# Patient Record
Sex: Female | Born: 1960 | Race: White | Marital: Married | Smoking: Never smoker
Health system: Southern US, Community
[De-identification: ages and names within clinical notes are randomized; demographics above are authoritative.]

## PROBLEM LIST (undated history)

## (undated) DIAGNOSIS — H53489 Generalized contraction of visual field, unspecified eye: Secondary | ICD-10-CM

## (undated) DIAGNOSIS — J309 Allergic rhinitis, unspecified: Secondary | ICD-10-CM

## (undated) DIAGNOSIS — K8020 Calculus of gallbladder without cholecystitis without obstruction: Secondary | ICD-10-CM

## (undated) DIAGNOSIS — N3010 Interstitial cystitis (chronic) without hematuria: Secondary | ICD-10-CM

## (undated) DIAGNOSIS — F329 Major depressive disorder, single episode, unspecified: Secondary | ICD-10-CM

## (undated) DIAGNOSIS — E669 Obesity, unspecified: Secondary | ICD-10-CM

## (undated) DIAGNOSIS — F32A Depression, unspecified: Secondary | ICD-10-CM

## (undated) DIAGNOSIS — K5790 Diverticulosis of intestine, part unspecified, without perforation or abscess without bleeding: Secondary | ICD-10-CM

## (undated) DIAGNOSIS — K760 Fatty (change of) liver, not elsewhere classified: Secondary | ICD-10-CM

## (undated) DIAGNOSIS — M1990 Unspecified osteoarthritis, unspecified site: Secondary | ICD-10-CM

## (undated) DIAGNOSIS — F419 Anxiety disorder, unspecified: Secondary | ICD-10-CM

## (undated) HISTORY — DX: Anxiety disorder, unspecified: F41.9

## (undated) HISTORY — DX: Diverticulosis of intestine, part unspecified, without perforation or abscess without bleeding: K57.90

## (undated) HISTORY — DX: Major depressive disorder, single episode, unspecified: F32.9

## (undated) HISTORY — DX: Generalized contraction of visual field, unspecified eye: H53.489

## (undated) HISTORY — DX: Depression, unspecified: F32.A

## (undated) HISTORY — DX: Obesity, unspecified: E66.9

## (undated) HISTORY — DX: Unspecified osteoarthritis, unspecified site: M19.90

## (undated) HISTORY — DX: Interstitial cystitis (chronic) without hematuria: N30.10

## (undated) HISTORY — DX: Allergic rhinitis, unspecified: J30.9

## (undated) HISTORY — DX: Calculus of gallbladder without cholecystitis without obstruction: K80.20

## (undated) HISTORY — PX: CHOLECYSTECTOMY: SHX55

## (undated) HISTORY — DX: Fatty (change of) liver, not elsewhere classified: K76.0

---

## 2004-09-01 DIAGNOSIS — K8020 Calculus of gallbladder without cholecystitis without obstruction: Secondary | ICD-10-CM

## 2004-09-01 HISTORY — DX: Calculus of gallbladder without cholecystitis without obstruction: K80.20

## 2010-12-31 HISTORY — PX: OTHER SURGICAL HISTORY: SHX169

## 2011-04-23 ENCOUNTER — Other Ambulatory Visit: Payer: Self-pay | Admitting: Orthopaedic Surgery

## 2011-04-23 ENCOUNTER — Ambulatory Visit
Admission: RE | Admit: 2011-04-23 | Discharge: 2011-04-23 | Disposition: A | Discharge status: Home / Self Care | Payer: PRIVATE HEALTH INSURANCE | Source: Ambulatory Visit | Attending: Orthopaedic Surgery | Admitting: Orthopaedic Surgery

## 2011-04-23 DIAGNOSIS — IMO0002 Reserved for concepts with insufficient information to code with codable children: Secondary | ICD-10-CM

## 2013-03-01 HISTORY — PX: OTHER SURGICAL HISTORY: SHX169

## 2013-09-08 ENCOUNTER — Encounter: Payer: Self-pay | Admitting: Gastroenterology

## 2013-09-19 ENCOUNTER — Encounter: Payer: Self-pay | Admitting: Gastroenterology

## 2013-09-19 ENCOUNTER — Ambulatory Visit (INDEPENDENT_AMBULATORY_CARE_PROVIDER_SITE_OTHER): Payer: BC Managed Care – PPO | Admitting: Gastroenterology

## 2013-09-19 VITALS — BP 130/70 | HR 66 | Ht 64.25 in | Wt 249.0 lb

## 2013-09-19 DIAGNOSIS — K5732 Diverticulitis of large intestine without perforation or abscess without bleeding: Secondary | ICD-10-CM

## 2013-09-19 DIAGNOSIS — R933 Abnormal findings on diagnostic imaging of other parts of digestive tract: Secondary | ICD-10-CM

## 2013-09-19 DIAGNOSIS — K8020 Calculus of gallbladder without cholecystitis without obstruction: Secondary | ICD-10-CM

## 2013-09-19 MED ORDER — PEG-KCL-NACL-NASULF-NA ASC-C 100 G PO SOLR: 1.0000 | Freq: Once | ORAL | Status: DC

## 2013-09-19 NOTE — Patient Instructions (Signed)
You have been scheduled for a colonoscopy with propofol. Please follow written instructions given to you at your visit today.  Please pick up your prep kit at the pharmacy within the next 1-3 days. If you use inhalers (even only as needed), please bring them with you on the day of your procedure. Your physician has requested that you go to www.startemmi.com and enter the access code given to you at your visit today. This web site gives a general overview about your procedure. However, you should still follow specific instructions given to you by our office regarding your preparation for the procedure.  Please schedule an appointment with a PCP for ongoing care. High-Fiber Diet Fiber is found in fruits, vegetables, and grains. A high-fiber diet encourages the addition of more whole grains, legumes, fruits, and vegetables in your diet. The recommended amount of fiber for adult males is 38 g per day. For adult females, it is 25 g per day. Pregnant and lactating women should get 28 g of fiber per day. If you have a digestive or bowel problem, ask your caregiver for advice before adding high-fiber foods to your diet. Eat a variety of high-fiber foods instead of only a select few type of foods.  PURPOSE  To increase stool bulk.  To make bowel movements more regular to prevent constipation.  To lower cholesterol.  To prevent overeating. WHEN IS THIS DIET USED?  It may be used if you have constipation and hemorrhoids.  It may be used if you have uncomplicated diverticulosis (intestine condition) and irritable bowel syndrome.  It may be used if you need help with weight management.  It may be used if you want to add it to your diet as a protective measure against atherosclerosis, diabetes, and cancer. SOURCES OF FIBER  Whole-grain breads and cereals.  Fruits, such as apples, oranges, bananas, berries, prunes, and pears.  Vegetables, such as green peas, carrots, sweet potatoes, beets, broccoli,  cabbage, spinach, and artichokes.  Legumes, such split peas, soy, lentils.  Almonds. FIBER CONTENT IN FOODS Starches and Grains / Dietary Fiber (g)  Cheerios, 1 cup / 3 g  Corn Flakes cereal, 1 cup / 0.7 g  Rice crispy treat cereal, 1 cup / 0.3 g  Instant oatmeal (cooked),  cup / 2 g  Frosted wheat cereal, 1 cup / 5.1 g  Brown, long-grain rice (cooked), 1 cup / 3.5 g  White, long-grain rice (cooked), 1 cup / 0.6 g  Enriched macaroni (cooked), 1 cup / 2.5 g Legumes / Dietary Fiber (g)  Baked beans (canned, plain, or vegetarian),  cup / 5.2 g  Kidney beans (canned),  cup / 6.8 g  Pinto beans (cooked),  cup / 5.5 g Breads and Crackers / Dietary Fiber (g)  Plain or honey graham crackers, 2 squares / 0.7 g  Saltine crackers, 3 squares / 0.3 g  Plain, salted pretzels, 10 pieces / 1.8 g  Whole-wheat bread, 1 slice / 1.9 g  White bread, 1 slice / 0.7 g  Raisin bread, 1 slice / 1.2 g  Plain bagel, 3 oz / 2 g  Flour tortilla, 1 oz / 0.9 g  Corn tortilla, 1 small / 1.5 g  Hamburger or hotdog bun, 1 small / 0.9 g Fruits / Dietary Fiber (g)  Apple with skin, 1 medium / 4.4 g  Sweetened applesauce,  cup / 1.5 g  Banana,  medium / 1.5 g  Grapes, 10 grapes / 0.4 g  Orange, 1 small /  2.3 g  Raisin, 1.5 oz / 1.6 g  Melon, 1 cup / 1.4 g Vegetables / Dietary Fiber (g)  Green beans (canned),  cup / 1.3 g  Carrots (cooked),  cup / 2.3 g  Broccoli (cooked),  cup / 2.8 g  Peas (cooked),  cup / 4.4 g  Mashed potatoes,  cup / 1.6 g  Lettuce, 1 cup / 0.5 g  Corn (canned),  cup / 1.6 g  Tomato,  cup / 1.1 g Document Released: 08/18/2005 Document Revised: 02/17/2012 Document Reviewed: 11/20/2011 [facility] Patient Information 2014 [place], [place].

## 2013-09-19 NOTE — Progress Notes (Signed)
    History of Present Illness: This is a 53 year old female with alternating diarrhea constipation for many years and intermittent abdominal pain. She states over 7 or 8 years she has frequent attacks of epigastric pain that radiates into her chest lasting 10 minutes to one hour. She relates that gallstones have been diagnosed in the past and were seen on her recent CT scan. In addition she states fatty liver has been noted on previous ultrasound exams most recently in September 2014. 2 weeks ago she developed acute suprapubic pain and was diagnosed with diverticulitis. CT scan showed cholelithiasis, abnormal sigmoid colon with surrounding inflammation and a small umbilical hernia containing fat. She was treated with a ten-day course of Levaquin and Bactrim her symptoms have resolved. Denies weight loss, change in stool caliber, melena, hematochezia, nausea, vomiting, dysphagia, reflux symptoms, chest pain.  Review of Systems: Pertinent positive and negative review of systems were noted in the above HPI section. All other review of systems were otherwise negative.  Current Medications, Allergies, Past Medical History, Past Surgical History, Family History and Social History were reviewed in [employer] record.  Physical Exam: General: Well developed , well nourished, obese, no acute distress Head: Normocephalic and atraumatic Eyes:  sclerae anicteric, EOMI Ears: Normal auditory acuity Mouth: No deformity or lesions Neck: Supple, no masses or thyromegaly Lungs: Clear throughout to auscultation Heart: Regular rate and rhythm; no murmurs, rubs or bruits Abdomen: Soft, non tender and non distended. No masses, hepatosplenomegaly or hernias noted. Normal Bowel sounds Rectal: Deferred to colonoscopy Musculoskeletal: Symmetrical with no gross deformities  Skin: No lesions on visible extremities Pulses:  Normal pulses noted Extremities: No clubbing, cyanosis, edema or deformities  noted Neurological: Alert oriented x 4, grossly nonfocal Cervical Nodes:  No significant cervical adenopathy Inguinal Nodes: No significant inguinal adenopathy Psychological:  Alert and cooperative. Normal mood and affect  Assessment and Recommendations:  1. Recent diverticulitis with abnormal sigmoid colon on CT. Long-term alternating diarrhea and constipation. Schedule colonoscopy. High fiber diet with adequate daily water intake. The risks, benefits, and alternatives to colonoscopy with possible biopsy and possible polypectomy were discussed with the patient and they consent to proceed.   2. Fatty liver. Long-term weight loss program with a low fat diet advised by her primary physician. Request records from September 2014 ultrasound.  3. Cholelithiasis. Intermittent epigastric pain could be symptomatic cholelithiasis. Surgery consult after colonoscopy completed.

## 2013-09-20 ENCOUNTER — Encounter: Payer: Self-pay | Admitting: Gastroenterology

## 2013-11-01 ENCOUNTER — Telehealth: Payer: Self-pay | Admitting: Gastroenterology

## 2013-11-02 NOTE — Telephone Encounter (Signed)
Patient was started on cipro and flagyl last Friday for diverticulitis.  She has approximately 7 more days to take.  She is still having some discomfort, but over all is feeling better.  She is currently scheduled for a colonoscopy for 11/18/13.  If all symptoms resolve is it ok to keep that appt?

## 2013-11-02 NOTE — Telephone Encounter (Signed)
Patient notified.  She will call back if the pain does not resolve

## 2013-11-02 NOTE — Telephone Encounter (Signed)
Left message for patient to call back  

## 2013-11-02 NOTE — Telephone Encounter (Signed)
Patient was started on cipro and flagyl for diverticulitis last Friday.  She is still having some discomfort.  She is

## 2013-11-02 NOTE — Telephone Encounter (Signed)
OK to proceed with colonoscopy presuming her abd pain continues to improve and ideally resolve before colonoscopy.

## 2013-11-11 ENCOUNTER — Ambulatory Visit (INDEPENDENT_AMBULATORY_CARE_PROVIDER_SITE_OTHER): Payer: BC Managed Care – PPO | Admitting: Physician Assistant

## 2013-11-11 ENCOUNTER — Other Ambulatory Visit (INDEPENDENT_AMBULATORY_CARE_PROVIDER_SITE_OTHER): Payer: BC Managed Care – PPO

## 2013-11-11 ENCOUNTER — Encounter: Payer: Self-pay | Admitting: Physician Assistant

## 2013-11-11 ENCOUNTER — Telehealth: Payer: Self-pay | Admitting: Gastroenterology

## 2013-11-11 VITALS — BP 130/70 | HR 72 | Ht 64.25 in | Wt 248.6 lb

## 2013-11-11 DIAGNOSIS — K5732 Diverticulitis of large intestine without perforation or abscess without bleeding: Secondary | ICD-10-CM | POA: Insufficient documentation

## 2013-11-11 DIAGNOSIS — K8020 Calculus of gallbladder without cholecystitis without obstruction: Secondary | ICD-10-CM | POA: Insufficient documentation

## 2013-11-11 DIAGNOSIS — K7689 Other specified diseases of liver: Secondary | ICD-10-CM

## 2013-11-11 DIAGNOSIS — K760 Fatty (change of) liver, not elsewhere classified: Secondary | ICD-10-CM | POA: Insufficient documentation

## 2013-11-11 DIAGNOSIS — R109 Unspecified abdominal pain: Secondary | ICD-10-CM

## 2013-11-11 LAB — BASIC METABOLIC PANEL
BUN: 11 mg/dL (ref 6–23)
CHLORIDE: 102 meq/L (ref 96–112)
CO2: 29 mEq/L (ref 19–32)
Calcium: 9.5 mg/dL (ref 8.4–10.5)
Creatinine, Ser: 0.8 mg/dL (ref 0.4–1.2)
GFR: 82.78 mL/min (ref >60.00)
Glucose, Bld: 103 mg/dL — ABNORMAL HIGH (ref 70–99)
Potassium: 4.6 mEq/L (ref 3.5–5.1)
SODIUM: 138 meq/L (ref 135–145)

## 2013-11-11 LAB — CBC WITH DIFFERENTIAL/PLATELET
BASOS ABS: 0.1 10*3/uL (ref 0.0–0.1)
Basophils Relative: 0.7 % (ref 0.0–3.0)
Eosinophils Absolute: 0.2 10*3/uL (ref 0.0–0.7)
Eosinophils Relative: 2.9 % (ref 0.0–5.0)
HCT: 42 % (ref 36.0–46.0)
Hemoglobin: 14.2 g/dL (ref 12.0–15.0)
LYMPHS PCT: 30.2 % (ref 12.0–46.0)
Lymphs Abs: 2.5 10*3/uL (ref 0.7–4.0)
MCHC: 33.8 g/dL (ref 30.0–36.0)
MCV: 86.4 fl (ref 78.0–100.0)
Monocytes Absolute: 0.6 10*3/uL (ref 0.1–1.0)
Monocytes Relative: 7.1 % (ref 3.0–12.0)
NEUTROS ABS: 4.9 10*3/uL (ref 1.4–7.7)
Neutrophils Relative %: 59.1 % (ref 43.0–77.0)
PLATELETS: 278 10*3/uL (ref 150.0–400.0)
RBC: 4.86 Mil/uL (ref 3.87–5.11)
RDW: 14.1 % (ref 11.5–14.6)
WBC: 8.3 10*3/uL (ref 4.5–10.5)

## 2013-11-11 MED ORDER — AMOXICILLIN-POT CLAVULANATE 875-125 MG PO TABS: 1.0000 | ORAL_TABLET | Freq: Two times a day (BID) | ORAL | Status: DC

## 2013-11-11 NOTE — Patient Instructions (Signed)
We have given you samples of Restoroa, a probiotic. Take a probiotic , 1 tab daily for 3 weeks. We sent a prescription to [facility] for Augmentin.  We changed the date of the colonosocopy with Dr. Fuller Plan to 3-37-2015.  9:30 am . We have given you new instructions.

## 2013-11-11 NOTE — Telephone Encounter (Signed)
Patient reports lower abdominal pain that started yesterday.  She reports constipation and pressure.  She denies fever.  Was treated with amoxicillin at an urgent care and finished this on Monday.  She will come in today at 1:30 for a CBC/BMET and office visit with Nicoletta Ba PA at 2:30

## 2013-11-11 NOTE — Progress Notes (Signed)
Subjective:    Patient ID: Laura Waters, female    DOB: 1960-12-03, 53 y.o.   MRN: [id number]  HPI  Laura Waters  is a 53 year old white female known to Dr. Fuller Plan who was seen in the office in January of 2015 with complaints of alternating diarrhea and constipation and history of diverticulitis. Apparently she had had an episode in September of 2014 that was documented on CT scan. She was treated again in February for diverticulitis and comes back in today with recurrent pain. She is scheduled for colonoscopy with Dr. Fuller Plan in about a week.  Patient also has a large gallstone which was found on CT scan measuring 2.8 cm. She has had episodes of pain consistent with fairly or a colic but states she's not interested in having her gallbladder taken out at this time. She also has a diagnosis of fatty liver. Her most recent course of antibiotics given by primary care was Levaquin and Bactrim which she took for about 10 days and finished about 5 days ago. She says when she finished the antibiotics her pain had resolved but quickly recurred within 2 days. She says the pain is not severe but feels like the diverticulitis had previously. This started again yesterday. She's not had any fever or chills. She has had some mild urinary frequency and says that she had been constipated and has been using Colace. Her bowels are currently moving more regularly. CT scan done a [facility] in January of 2015 had documented a 2.8 cm gallstone without evidence of inflammatory changes of the gallbladder. There was a tiny fat-containing umbilical hernia and a short segment of inflammatory changes about the distal sigmoid colon likely secondary to diverticulitis.  Review of Systems  Constitutional: Positive for appetite change.  HENT: Negative.   Eyes: Negative.   Respiratory: Negative.   Cardiovascular: Negative.   Gastrointestinal: Positive for abdominal pain and constipation.  Endocrine: Negative.   Genitourinary:  Positive for frequency.  Allergic/Immunologic: Negative.   Neurological: Negative.   Hematological: Negative.   Psychiatric/Behavioral: Negative.    Outpatient Prescriptions Prior to Visit  Medication Sig Dispense Refill  . ALPRAZolam (XANAX) 0.25 MG tablet Take 0.25 mg by mouth as needed for anxiety.      . cetirizine (ZYRTEC) 10 MG tablet Take 10 mg by mouth as needed for allergies.      . CHOLINE PO Take 1 capsule by mouth daily.      Marland Kitchen OVER THE COUNTER MEDICATION Digestive enzymes, 2 capsules with meal and 1 with snacks      . OVER THE COUNTER MEDICATION Folate 800 mcg , one a day      . peg 3350 powder (MOVIPREP) 100 G SOLR Take 1 kit (200 g total) by mouth once.  1 kit  0  . Magnesium Gluconate 200 MG TABS Take 1 tablet by mouth 2 (two) times daily.       No facility-administered medications prior to visit.   Allergies  Allergen Reactions  . Flagyl [Metronidazole]   . Lidocaine   . Tylenol [Acetaminophen]    Patient Active Problem List   Diagnosis Date Noted  . Diverticulitis of colon without hemorrhage 11/11/2013  . Cholelithiasis 11/11/2013  . Fatty liver 11/11/2013   History  Substance Use Topics  . Smoking status: Never Smoker   . Smokeless tobacco: Never Used  . Alcohol Use: No     Comment: quit in 05/2013       Objective:   Physical Exam  white  female in no acute distress blood pressure 130/70 pulse 72 height 5 foot 4 weight 248, BMI 42. HEENT; nontraumatic normocephalic EOMI PERRLA sclera anicteric, Supple; no JVD, Cardiovascular; regular rate and rhythm with S1-S2 no murmur or gallop, Pulmonary ;clear bilaterally, Abdomen; large soft she is tender in the deep left lower quadrant and suprapubic area there is no guarding or rebound no palpable mass or hepatosplenomegaly bowel sounds are present, Rectal; exam not done, Extremities; no clubbing cyanosis or edema skin warm and dry, Psych; mood and affect appropriate        Assessment & Plan:  #78  53 year old  female with recurrent diverticulitis currently presenting with recurrent lower abdominal  discomfort within 3 days of finishing a course of antibiotics and probably representing a partially treated episode. #2Cholelithiasis #3 fatty liver  Plan; Will treat with a 10 day course of Augmentin 875 mg by mouth twice daily to be taken with food Start Restora  one by mouth daily x3-4 weeks Will reschedule her colonoscopy for 3-4 weeks from now to allow resolution of any residual diverticulitis.patient is aware to call next week if she does not feel that she is improving or if any point if she feels her pain is worsening.

## 2013-11-13 NOTE — Progress Notes (Signed)
Reviewed and agree with management plan.  Talishia Betzler T. Roshaun Pound, MD FACG 

## 2013-11-14 ENCOUNTER — Ambulatory Visit: Payer: BC Managed Care – PPO | Admitting: Physician Assistant

## 2013-11-17 ENCOUNTER — Encounter: Payer: Self-pay | Admitting: *Deleted

## 2013-11-18 ENCOUNTER — Other Ambulatory Visit: Payer: Self-pay | Admitting: Gastroenterology

## 2013-11-18 ENCOUNTER — Encounter: Payer: BC Managed Care – PPO | Admitting: Gastroenterology

## 2013-11-18 DIAGNOSIS — R1032 Left lower quadrant pain: Secondary | ICD-10-CM

## 2013-11-18 MED ORDER — AMOXICILLIN-POT CLAVULANATE 875-125 MG PO TABS: 1.0000 | ORAL_TABLET | Freq: Two times a day (BID) | ORAL | Status: DC

## 2013-11-18 MED ORDER — AMOXICILLIN-POT CLAVULANATE 875-125 MG PO TABS: 1.0000 | ORAL_TABLET | Freq: Two times a day (BID) | ORAL | Status: AC

## 2013-11-18 NOTE — Telephone Encounter (Signed)
Schedule abd/pelvic CT to R/O abscess or other complication Extend Augmentin for 7 more days Add metronidazole 500 mg po bid for 10 days Schedule office visit next week, preferably after the CT Reschedule colonoscopy no sooner than later in the month of April

## 2013-11-18 NOTE — Telephone Encounter (Signed)
Per Dr. Fuller Plan d/c flagyl and get CT asap.   She is scheduled for CT scan Monday 11/21/13 4:15, she is aware to come pick up instuctions and contrast by 2:15 on Monday and have no solids after noon on Monday REV scheduled with Tye Savoy RNP on 11/22/13 8:30 Colonoscopy rescheduled for 01/06/14 2:00

## 2013-11-18 NOTE — Telephone Encounter (Signed)
Patient was started on Augmentin on 11/11/13 for presumed diverticulitis.  She states pain is better but not resolved and she has consistent pain since yesterday pm.  She has 3 days left of antibiotics.  She was instructed to call back with an update.  She is scheduled for a colonoscopy for 11/25/13.  I do not have any APP appts today, but I could have her seen next week,  please advise the next step?

## 2013-11-18 NOTE — Telephone Encounter (Signed)
Left message for patient to call back Patient has a documented allergy to flagyl.  Do you want an alternative?

## 2013-11-18 NOTE — Addendum Note (Signed)
Addended by: Marlon Pel on: 11/18/2013 02:11 PM   Modules accepted: Orders

## 2013-11-18 NOTE — Addendum Note (Signed)
Addended by: Marlon Pel on: 11/18/2013 02:22 PM   Modules accepted: Orders

## 2013-11-21 ENCOUNTER — Ambulatory Visit (INDEPENDENT_AMBULATORY_CARE_PROVIDER_SITE_OTHER)
Admission: RE | Admit: 2013-11-21 | Discharge: 2013-11-21 | Disposition: A | Discharge status: Home / Self Care | Payer: BC Managed Care – PPO | Source: Ambulatory Visit | Attending: Gastroenterology | Admitting: Gastroenterology

## 2013-11-21 DIAGNOSIS — R1032 Left lower quadrant pain: Secondary | ICD-10-CM

## 2013-11-21 MED ORDER — IOHEXOL 300 MG/ML  SOLN
100.0000 mL | Freq: Once | INTRAMUSCULAR | Status: AC | PRN
  Administered 2013-11-21: 100 mL via INTRAVENOUS

## 2013-11-22 ENCOUNTER — Encounter: Payer: Self-pay | Admitting: Nurse Practitioner

## 2013-11-22 ENCOUNTER — Ambulatory Visit (INDEPENDENT_AMBULATORY_CARE_PROVIDER_SITE_OTHER): Payer: BC Managed Care – PPO | Admitting: Nurse Practitioner

## 2013-11-22 VITALS — BP 124/70 | HR 70 | Ht 64.25 in | Wt 244.8 lb

## 2013-11-22 DIAGNOSIS — K5732 Diverticulitis of large intestine without perforation or abscess without bleeding: Secondary | ICD-10-CM

## 2013-11-22 MED ORDER — HYOSCYAMINE SULFATE 0.125 MG SL SUBL: SUBLINGUAL_TABLET | SUBLINGUAL | Status: DC

## 2013-11-22 NOTE — Progress Notes (Signed)
Reviewed and agree with management plan.  Skyleigh Windle T. Talya Quain, MD FACG 

## 2013-11-22 NOTE — Progress Notes (Signed)
     History of Present Illness:  Patient is a 53 year old known to Dr. Fuller Plan. She was seen here a few days ago by Nicoletta Ba, P.A for evaluation of LLQ pain. Patient has a history of diverticulitis documented by CT scan in January done at [facility]. For diverticulitis type pain a few days ago we prescribed a ten-day course of Augmentin. Patient called the office with recurrent symptoms, we extended her antibiotics by another week and scheduled CT scan which was done yesterday. No findings of diverticulitis /abscess seen on the scan but patient continues to have left lower quadrant pain. Pain almost constant though having BM does tend to help She saw GYN yesterday, negative exam and told urinalysis was negative. No fever. No nausea. BMs at baseline. No rectal bleeding. No unusual weight loss.   Current Medications, Allergies, Past Medical History, Past Surgical History, Family History and Social History were reviewed in [employer] record.  Studies:   Ct Abdomen Pelvis W Contrast  11/21/2013   CLINICAL DATA:  Left lower quadrant pain. Evaluate for diverticular abscess.  EXAM: CT ABDOMEN AND PELVIS WITH CONTRAST  TECHNIQUE: Multidetector CT imaging of the abdomen and pelvis was performed using the standard protocol following bolus administration of intravenous contrast.  CONTRAST:  149mL OMNIPAQUE IOHEXOL 300 MG/ML  SOLN  COMPARISON:  None.  FINDINGS: The lung bases are clear. No pleural or pericardial effusion identified. There is no focal liver abnormality. Large gallstone measures 3 cm, image 32/series 2. There is no biliary dilatation. Normal appearance of the pancreas. The spleen is unremarkable.  The adrenal glands both appear normal. Normal appearance of the right kidney. The left kidney is normal. The urinary bladder appears within normal limits. The uterus and the adnexal structures of both appear normal.  Normal caliber of the abdominal aorta. No aneurysm. There  is no upper abdominal adenopathy. No pelvic or inguinal adenopathy identified.  The stomach is normal. The small bowel loops have a normal course and caliber. No obstruction. Normal appearance of the appendix. The colon is on unremarkable.  No evidence for diverticulitis or abscess. No significant free fluid or abnormal fluid collections within the abdomen or pelvis.  Review of the visualized bony structures is significant for lumbar spondylosis. There are no aggressive lytic or sclerotic bone lesions identified.  IMPRESSION: 1. No evidence for diverticulitis or abscess. 2. Gallstone.   Electronically Signed   By: Kerby Moors M.D.   On: 11/21/2013 18:40   Physical Exam: General: Pleasant, well developed , white female in no acute distress Head: Normocephalic and atraumatic Eyes:  sclerae anicteric, conjunctiva pink  Ears: Normal auditory acuity Lungs: Clear throughout to auscultation Heart: Regular rate and rhythm Abdomen: Soft, non distended, non-tender. No masses, no hepatomegaly. Normal bowel sounds Musculoskeletal: Symmetrical with no gross deformities  Extremities: No edema  Neurological: Alert oriented x 4, grossly nonfocal Psychological:  Alert and cooperative. Normal mood and affect  Assessment and Recommendations:  102. 53 year old female with persistent left lower quadrant pain despite negative CT scan yesterday. Patient does have a history of documented diverticulitis. Advised her to continue the remaining Augmentin prescription. Will try Levsin sublingual as needed. Patient's screening colonoscopy was set back several weeks as we thought she had recurrent diverticulitis. Given negative scan and ongoing symptoms it is reasonable to move the colonoscopy back to a sooner date.  2. Cholelithiasis

## 2013-11-22 NOTE — Patient Instructions (Signed)
We sent a prescritpion to [place], [place] for Levsin sublingual tablets for cramping and spasms. We changed the colonoscopy date to Friday 11-25-2013. New instructions provided.

## 2013-11-25 ENCOUNTER — Ambulatory Visit (AMBULATORY_SURGERY_CENTER): Payer: BC Managed Care – PPO | Admitting: Gastroenterology

## 2013-11-25 ENCOUNTER — Encounter: Payer: BC Managed Care – PPO | Admitting: Gastroenterology

## 2013-11-25 ENCOUNTER — Encounter: Payer: Self-pay | Admitting: Gastroenterology

## 2013-11-25 VITALS — BP 110/68 | HR 72 | Temp 97.2°F | Resp 19

## 2013-11-25 DIAGNOSIS — R1032 Left lower quadrant pain: Secondary | ICD-10-CM

## 2013-11-25 DIAGNOSIS — R933 Abnormal findings on diagnostic imaging of other parts of digestive tract: Secondary | ICD-10-CM

## 2013-11-25 DIAGNOSIS — K5732 Diverticulitis of large intestine without perforation or abscess without bleeding: Secondary | ICD-10-CM

## 2013-11-25 DIAGNOSIS — D126 Benign neoplasm of colon, unspecified: Secondary | ICD-10-CM

## 2013-11-25 MED ORDER — SODIUM CHLORIDE 0.9 % IV SOLN: 500.0000 mL | INTRAVENOUS | Status: DC

## 2013-11-25 NOTE — Patient Instructions (Signed)
YOU HAD AN ENDOSCOPIC PROCEDURE TODAY AT THE [facility]: Refer to the procedure report that was given to you for any specific questions about what was found during the examination.  If the procedure report does not answer your questions, please call your gastroenterologist to clarify.  If you requested that your care partner not be given the details of your procedure findings, then the procedure report has been included in a sealed envelope for you to review at your convenience later.  YOU SHOULD EXPECT: Some feelings of bloating in the abdomen. Passage of more gas than usual.  Walking can help get rid of the air that was put into your GI tract during the procedure and reduce the bloating. If you had a lower endoscopy (such as a colonoscopy or flexible sigmoidoscopy) you may notice spotting of blood in your stool or on the toilet paper. If you underwent a bowel prep for your procedure, then you may not have a normal bowel movement for a few days.  DIET: Your first meal following the procedure should be a light meal and then it is ok to progress to your normal diet.  A half-sandwich or bowl of soup is an example of a good first meal.  Heavy or fried foods are harder to digest and may make you feel nauseous or bloated.  Likewise meals heavy in dairy and vegetables can cause extra gas to form and this can also increase the bloating.  Drink plenty of fluids but you should avoid alcoholic beverages for 24 hours.  ACTIVITY: Your care partner should take you home directly after the procedure.  You should plan to take it easy, moving slowly for the rest of the day.  You can resume normal activity the day after the procedure however you should NOT DRIVE or use heavy machinery for 24 hours (because of the sedation medicines used during the test).    SYMPTOMS TO REPORT IMMEDIATELY: A gastroenterologist can be reached at any hour.  During normal business hours, 8:30 AM to 5:00 PM Monday through Friday,  call [phone].  After hours and on weekends, please call the GI answering service at [phone] who will take a message and have the physician on call contact you.   Following lower endoscopy (colonoscopy or flexible sigmoidoscopy):  Excessive amounts of blood in the stool  Significant tenderness or worsening of abdominal pains  Swelling of the abdomen that is new, acute  Fever of 100F or higher  FOLLOW UP: If any biopsies were taken you will be contacted by phone or by letter within the next 1-3 weeks.  Call your gastroenterologist if you have not heard about the biopsies in 3 weeks.  Our staff will call the home number listed on your records the next business day following your procedure to check on you and address any questions or concerns that you may have at that time regarding the information given to you following your procedure. This is a courtesy call and so if there is no answer at the home number and we have not heard from you through the emergency physician on call, we will assume that you have returned to your regular daily activities without incident.  SIGNATURES/CONFIDENTIALITY: You and/or your care partner have signed paperwork which will be entered into your electronic medical record.  These signatures attest to the fact that that the information above on your After Visit Summary has been reviewed and is understood.  Full responsibility of the confidentiality of this   discharge information lies with you and/or your care-partner.  Recommendations High fiber diet with liberal fluid intake Await pathology results Next colonoscopy will be determined by pathology results Begin previously prescribed Levsin

## 2013-11-25 NOTE — Progress Notes (Signed)
Called to room to assist during endoscopic procedure.  Patient ID and intended procedure confirmed with present staff. Received instructions for my participation in the procedure from the performing physician.  

## 2013-11-25 NOTE — Op Note (Signed)
[place]  [address]. [place], [address]   COLONOSCOPY PROCEDURE REPORT PATIENT: Laura Waters, Laura Waters  MR#: [id number] BIRTHDATE: [birth year] , 52  yrs. old GENDER: Female ENDOSCOPIST: Ladene Artist, MD, [facility] REFERRED KK:XFGHW Ronnald Ramp, PA-C PROCEDURE DATE:  11/25/2013 PROCEDURE:   Colonoscopy with snare polypectomy First Screening Colonoscopy - Avg.  risk and is 50 yrs.  old or older - No.  Prior Negative Screening - Now for repeat screening. N/A  History of Adenoma - Now for follow-up colonoscopy & has been > or = to 3 yrs.  N/A  Polyps Removed Today? Yes. ASA CLASS:   Class II INDICATIONS:an abnormal CT, abdominal pain in the lower left quadrant, and recent diverticulitis. MEDICATIONS: MAC sedation, administered by CRNA and propofol (Diprivan) 250mg  IV DESCRIPTION OF PROCEDURE:   After the risks benefits and alternatives of the procedure were thoroughly explained, informed consent was obtained.  A digital rectal exam revealed no abnormalities of the rectum.   The LB [id number]  endoscope was introduced through the anus and advanced to the cecum, which was identified by both the appendix and ileocecal valve. No adverse events experienced.   The quality of the prep was excellent, using MoviPrep  The instrument was then slowly withdrawn as the colon was fully examined.  COLON FINDINGS: Mild diverticulosis was noted in the sigmoid colon. A sessile polyp measuring 5 mm in size was found in the sigmoid colon.  A polypectomy was performed with a cold snare.  The resection was complete and the polyp tissue was completely retrieved.   The colon was otherwise normal.  There was no diverticulosis, inflammation, polyps or cancers unless previously stated.  Retroflexed views revealed internal hemorrhoids. The time to cecum=3 minutes 20 seconds.  Withdrawal time=11 minutes 43 seconds.  The scope was withdrawn and the procedure completed. COMPLICATIONS: There were no  complications.  ENDOSCOPIC IMPRESSION: 1.   Mild diverticulosis in the sigmoid colon 2.   Sessile polyp measuring 5 mm in the sigmoid colon; polypectomy performed with a cold snare 3.   Small internal hemorrhoids  RECOMMENDATIONS: 1.  High fiber diet with liberal fluid intake. 2.  Await pathology results 3.  Repeat colonoscopy in 5 years if polyp adenomatous; otherwise 10 years 4.  Begin previously prescribed Levsin 1-2 ac and q4h as needed for abd pain  eSigned:  Ladene Artist, MD, [facility] 11/25/2013 12:09 PM

## 2013-11-25 NOTE — Progress Notes (Signed)
Pt stable to RR 

## 2013-11-28 ENCOUNTER — Telehealth: Payer: Self-pay | Admitting: *Deleted

## 2013-11-28 NOTE — Telephone Encounter (Signed)
  Follow up Call-  Call back number 11/25/2013  Post procedure Call Back phone  # [phone]  Permission to leave phone message Yes     Patient questions:  Left message to call if necessary.

## 2013-12-04 ENCOUNTER — Encounter: Payer: Self-pay | Admitting: Gastroenterology

## 2014-01-06 ENCOUNTER — Encounter: Payer: BC Managed Care – PPO | Admitting: Gastroenterology

## 2014-05-15 ENCOUNTER — Telehealth: Payer: Self-pay | Admitting: Gastroenterology

## 2014-05-15 NOTE — Telephone Encounter (Signed)
If pain is better after BM unlikely to be diverticulitis. Agree with office evaluation tomorrow.  Levsin 1-2 q4h prn abd pain

## 2014-05-15 NOTE — Telephone Encounter (Signed)
Patient reports abdominal pain" in the center and it seems to go through to my back".  She reports that she had pain all through the night, but this am she had a BM and moved around and feels better.  Patient reports that this is the same pain that she had when she was treated for diverticulitis in March. CT scan was negative for diverticulitis, but was treated empirically for LLQ pain at the time See office notes from 11/22/13.  Patient is scheduled for an office visit with Tye Savoy RNP tomorrow at 3:00.  She is concerned about waiting until "If I have diverticulitis coming on".  She denies fever "but I felt hot last night", rectal bleeding, nausea, vomiting, diarrhea, constipation or other GI complaints.  She is asking "can't he just call in some antibiotics".  Please advise

## 2014-05-15 NOTE — Telephone Encounter (Signed)
Patient notified She has levsin at home

## 2014-05-16 ENCOUNTER — Ambulatory Visit: Payer: BC Managed Care – PPO | Admitting: Nurse Practitioner

## 2016-12-02 ENCOUNTER — Ambulatory Visit (INDEPENDENT_AMBULATORY_CARE_PROVIDER_SITE_OTHER): Payer: 59 | Admitting: Orthopaedic Surgery

## 2016-12-02 ENCOUNTER — Ambulatory Visit (INDEPENDENT_AMBULATORY_CARE_PROVIDER_SITE_OTHER): Payer: 59

## 2016-12-02 ENCOUNTER — Encounter (INDEPENDENT_AMBULATORY_CARE_PROVIDER_SITE_OTHER): Payer: Self-pay | Admitting: Orthopaedic Surgery

## 2016-12-02 VITALS — BP 148/98 | HR 78 | Resp 14 | Ht 63.0 in | Wt 250.0 lb

## 2016-12-02 DIAGNOSIS — G8929 Other chronic pain: Secondary | ICD-10-CM

## 2016-12-02 DIAGNOSIS — M25562 Pain in left knee: Secondary | ICD-10-CM | POA: Diagnosis not present

## 2016-12-02 MED ORDER — METHYLPREDNISOLONE ACETATE 40 MG/ML IJ SUSP
80.0000 mg | INTRAMUSCULAR | Status: AC | PRN
  Administered 2016-12-02: 80 mg
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
## 2018-07-26 LAB — RESULTS CONSOLE HPV: CHL HPV: NEGATIVE

## 2018-07-26 LAB — HM PAP SMEAR

## 2019-09-02 HISTORY — PX: AXILLARY NODE DISSECTION: SHX1211

## 2020-06-26 ENCOUNTER — Other Ambulatory Visit: Payer: Self-pay

## 2020-06-26 ENCOUNTER — Encounter: Payer: Self-pay | Admitting: Nurse Practitioner

## 2020-06-26 ENCOUNTER — Ambulatory Visit (INDEPENDENT_AMBULATORY_CARE_PROVIDER_SITE_OTHER): Payer: Managed Care, Other (non HMO) | Admitting: Nurse Practitioner

## 2020-06-26 VITALS — BP 124/78 | HR 79 | Temp 97.5°F | Ht 64.5 in | Wt 271.4 lb

## 2020-06-26 DIAGNOSIS — R1011 Right upper quadrant pain: Secondary | ICD-10-CM

## 2020-06-26 DIAGNOSIS — K8020 Calculus of gallbladder without cholecystitis without obstruction: Secondary | ICD-10-CM | POA: Diagnosis not present

## 2020-06-26 DIAGNOSIS — Z6841 Body Mass Index (BMI) 40.0 and over, adult: Secondary | ICD-10-CM

## 2020-06-26 DIAGNOSIS — M545 Low back pain, unspecified: Secondary | ICD-10-CM

## 2020-06-26 DIAGNOSIS — K219 Gastro-esophageal reflux disease without esophagitis: Secondary | ICD-10-CM

## 2020-06-26 LAB — POCT URINALYSIS DIPSTICK
Bilirubin, UA: NEGATIVE
Blood, UA: NEGATIVE
Glucose, UA: NEGATIVE
Ketones, UA: NEGATIVE
Leukocytes, UA: NEGATIVE
Nitrite, UA: NEGATIVE
Protein, UA: NEGATIVE
Spec Grav, UA: 1.01 (ref 1.010–1.025)
Urobilinogen, UA: 0.2 E.U./dL
pH, UA: 6 (ref 5.0–8.0)

## 2020-06-26 NOTE — Progress Notes (Signed)
Acute Office Visit  Subjective:    Patient ID: Laura Waters, female    DOB: Oct 20, 1960, 59 y.o.   MRN: [id number]  Chief Complaint  Patient presents with  . Right side pain    HPI Patient is a 59 year old Caucasian female that presents today with cramping, colicky right upper quadrant pain. Rates intermittent pain 6/10. She states the pain radiates around her back and into right shoulder.  The pain has an onset of 3 days ago.  Has been accompanied by belching acid reflux symptoms.  She states that she has known gallstones since 2014. She denies nausea, vomiting, diarrhea, or constipation.  She has not tried home treatments. Medical history includes GERD, fatty liver, obesity, diverticulosis, anxiety, and depression. She is gluten intolerant.  Allergies  Allergen Reactions  . Oxycodone-Acetaminophen Other (See Comments)    "makes me feel like I have the flu"  . Sulfamethoxazole-Trimethoprim Hives  . Flagyl [Metronidazole]   . Gluten Meal     GI upset  . Lidocaine   . Tylenol [Acetaminophen]     Review of Systems  Constitutional: Positive for fatigue. Negative for appetite change and fever.  HENT: Negative for sinus pressure, sinus pain and sore throat.   Respiratory: Negative for cough and shortness of breath.   Cardiovascular: Negative for chest pain and palpitations.  Gastrointestinal: Positive for abdominal pain (RUQ). Negative for constipation, diarrhea, nausea and vomiting.       GERD  Endocrine: Negative for cold intolerance, heat intolerance, polydipsia, polyphagia and polyuria.  Genitourinary: Negative for dysuria and flank pain.  Musculoskeletal: Positive for back pain.  Skin: Negative.   Allergic/Immunologic: Positive for food allergies (Gluten intolerance).  Neurological: Negative for weakness and headaches.  Psychiatric/Behavioral: Negative for dysphoric mood. The patient is not nervous/anxious.        Objective:    Physical Exam Vitals reviewed.    Constitutional:      Appearance: She is obese.  HENT:     Head: Normocephalic.     Mouth/Throat:     Mouth: Mucous membranes are moist.  Cardiovascular:     Rate and Rhythm: Normal rate and regular rhythm.     Pulses: Normal pulses.     Heart sounds: Normal heart sounds.  Pulmonary:     Effort: Pulmonary effort is normal.     Breath sounds: Normal breath sounds.  Abdominal:     General: Bowel sounds are normal.     Palpations: Abdomen is soft. There is no mass.     Tenderness: There is abdominal tenderness. There is no right CVA tenderness, left CVA tenderness, guarding or rebound.     Hernia: No hernia is present.  Musculoskeletal:        General: Tenderness (right shoulder) present.     Cervical back: Neck supple.  Skin:    General: Skin is warm and dry.     Capillary Refill: Capillary refill takes less than 2 seconds.  Neurological:     General: No focal deficit present.     Mental Status: She is alert and oriented to person, place, and time.  Psychiatric:        Mood and Affect: Mood normal.        Behavior: Behavior normal.     Vitals:   06/26/20 1441  BP: 124/78  Pulse: 79  Temp: (!) 97.5 F (36.4 C)  Height: 5' 4.5" (1.638 m)  Weight: 271 lb 6.4 oz (123.1 kg)  SpO2: 97%  TempSrc: Temporal  BMI (Calculated):  45.88        Assessment & Plan:   . 1. Colicky RUQ abdominal pain - US Abdomen Limited RUQ (LIVER/GB); Future - CBC with Differential - Comprehensive metabolic panel - H Pylori, IGM, IGG, IGA AB - Urinalysis Dipstick  2. Midline low back pain without sciatica, unspecified chronicity -Take OTC Aleve/Ibuprofen with food as needed  We will call you with lab results and [place] appointment Consume low-fat diet    Follow-up: As needed pending test results  An After Visit Summary was printed and given to the patient.  Rip Harbour, DNP  [facility] [phone]

## 2020-06-26 NOTE — Patient Instructions (Signed)
We will call you with lab and [place] appointment Consume low-fat diet  Abdominal Pain, Adult Many things can cause belly (abdominal) pain. Most times, belly pain is not dangerous. Many cases of belly pain can be watched and treated at home. Sometimes, though, belly pain is serious. Your doctor will try to find the cause of your belly pain. Follow these instructions at home:  Medicines  Take over-the-counter and prescription medicines only as told by your doctor.  Do not take medicines that help you poop (laxatives) unless told by your doctor. General instructions  Watch your belly pain for any changes.  Drink enough fluid to keep your pee (urine) pale yellow.  Keep all follow-up visits as told by your doctor. This is important. Contact a doctor if:  Your belly pain changes or gets worse.  You are not hungry, or you lose weight without trying.  You are having trouble pooping (constipated) or have watery poop (diarrhea) for more than 2-3 days.  You have pain when you pee or poop.  Your belly pain wakes you up at night.  Your pain gets worse with meals, after eating, or with certain foods.  You are vomiting and cannot keep anything down.  You have a fever.  You have blood in your pee. Get help right away if:  Your pain does not go away as soon as your doctor says it should.  You cannot stop vomiting.  Your pain is only in areas of your belly, such as the right side or the left lower part of the belly.  You have bloody or black poop, or poop that looks like tar.  You have very bad pain, cramping, or bloating in your belly.  You have signs of not having enough fluid or water in your body (dehydration), such as: ? Dark pee, very little pee, or no pee. ? Cracked lips. ? Dry mouth. ? Sunken eyes. ? Sleepiness. ? Weakness.  You have trouble breathing or chest pain. Summary  Many cases of belly pain can be watched and treated at home.  Watch your belly pain for any  changes.  Take over-the-counter and prescription medicines only as told by your doctor.  Contact a doctor if your belly pain changes or gets worse.  Get help right away if you have very bad pain, cramping, or bloating in your belly. This information is not intended to replace advice given to you by your health care provider. Make sure you discuss any questions you have with your health care provider. Document Revised: 12/27/2018 Document Reviewed: 12/27/2018 Elsevier Patient Education  [place] If you have a gallbladder condition, you may have trouble digesting fats. Eating a low-fat diet can help reduce your symptoms, and may be helpful before and after having surgery to remove your gallbladder (cholecystectomy). Your health care provider may recommend that you work with a diet and nutrition specialist (dietitian) to help you reduce the amount of fat in your diet. What are tips for following this plan? General guidelines  Limit your fat intake to less than 30% of your total daily calories. If you eat around 1,800 calories each day, this is less than 60 grams (g) of fat per day.  Fat is an important part of a healthy diet. Eating a low-fat diet can make it hard to maintain a healthy body weight. Ask your dietitian how much fat, calories, and other nutrients you need each day.  Eat small, frequent meals throughout the  day instead of three large meals.  Drink at least 8-10 cups of fluid a day. Drink enough fluid to keep your urine clear or pale yellow.  Limit alcohol intake to no more than 1 drink a day for nonpregnant women and 2 drinks a day for men. One drink equals 12 oz of beer, 5 oz of wine, or 1 oz of hard liquor. Reading food labels  Check Nutrition Facts on food labels for the amount of fat per serving. Choose foods with less than 3 grams of fat per serving. Shopping  Choose nonfat and low-fat healthy foods. Look for the words "nonfat," "low  fat," or "fat free."  Avoid buying processed or prepackaged foods. Cooking  Cook using low-fat methods, such as baking, broiling, grilling, or boiling.  Cook with small amounts of healthy fats, such as olive oil, grapeseed oil, canola oil, or sunflower oil. What foods are recommended?   All fresh, frozen, or canned fruits and vegetables.  Whole grains.  Low-fat or non-fat (skim) milk and yogurt.  Lean meat, skinless poultry, fish, eggs, and beans.  Low-fat protein supplement powders or drinks.  Spices and herbs. What foods are not recommended?  High-fat foods. These include baked goods, fast food, fatty cuts of meat, ice cream, french toast, sweet rolls, pizza, cheese bread, foods covered with butter, creamy sauces, or cheese.  Fried foods. These include french fries, tempura, battered fish, breaded chicken, fried breads, and sweets.  Foods with strong odors.  Foods that cause bloating and gas. Summary  A low-fat diet can be helpful if you have a gallbladder condition, or before and after gallbladder surgery.  Limit your fat intake to less than 30% of your total daily calories. This is about 60 g of fat if you eat 1,800 calories each day.  Eat small, frequent meals throughout the day instead of three large meals. This information is not intended to replace advice given to you by your health care provider. Make sure you discuss any questions you have with your health care provider. Document Revised: 12/09/2018 Document Reviewed: 09/25/2016 Elsevier Patient Education  2020 [place].  Abdominal or Pelvic Ultrasound An ultrasound is a test that uses sound waves to take pictures of the inside of the body. An abdominal ultrasound takes pictures of the inside of your belly (abdomen). A pelvic ultrasound takes pictures of the inside of the area between your hip bones (pelvis). An ultrasound may be done to check an organ or look for problems. This is a safe test that does not  hurt. It is done by placing a handheld device called a transducer on the outside of your belly or pelvis and moving it around. Tell your doctor about:  Any allergies you have.  All medicines you are taking.  Any surgeries you have had.  Any medical conditions you have.  Whether you are pregnant or may be pregnant. What are the risks? There are no known risks from having this test. What happens before the procedure?  Follow instructions from your doctor about eating or drinking before the test.  Wear clothing that is easy to wash. Gel from the test might get on your clothes. What happens during the procedure?   You will lie on an exam table.  Your clothes will be moved so your belly and pelvis are showing.  A gel will be put on your skin. It may feel cool.  The transducer device will be put on your skin. It will be moved back and  forth over the area being looked at.  The device will take pictures. They will show on small TV screens.  You may be asked to change your position.  After the exam, the gel will be cleaned off. What happens after the procedure?  It is up to you to get the results of your test. Ask your doctor, or the department that is doing the test, when your results will be ready.  Keep all follow-up visits as told by your doctor. This is important. Summary  An ultrasound is a test that uses sound waves to take pictures of the inside of the body.  An ultrasound may be done to check an organ or look for problems.  The test is done by moving a handheld device around on the outside of your belly or pelvis.  It is up to you to get the results of your test. Be sure to ask when your results will be ready. This information is not intended to replace advice given to you by your health care provider. Make sure you discuss any questions you have with your health care provider. Document Revised: 03/15/2018 Document Reviewed: 03/15/2018 Elsevier Patient Education   [place].

## 2020-06-27 LAB — COMPREHENSIVE METABOLIC PANEL
ALT: 30 IU/L (ref 0–32)
AST: 20 IU/L (ref 0–40)
Albumin/Globulin Ratio: 1.4 (ref 1.2–2.2)
Albumin: 4.3 g/dL (ref 3.8–4.9)
Alkaline Phosphatase: 98 IU/L (ref 44–121)
BUN/Creatinine Ratio: 19 (ref 9–23)
BUN: 16 mg/dL (ref 6–24)
Bilirubin Total: 0.2 mg/dL (ref 0.0–1.2)
CO2: 24 mmol/L (ref 20–29)
Calcium: 9.4 mg/dL (ref 8.7–10.2)
Chloride: 103 mmol/L (ref 96–106)
Creatinine, Ser: 0.84 mg/dL (ref 0.57–1.00)
GFR calc Af Amer: 89 mL/min/{1.73_m2} (ref >59)
GFR calc non Af Amer: 77 mL/min/{1.73_m2} (ref >59)
Globulin, Total: 3 g/dL (ref 1.5–4.5)
Glucose: 89 mg/dL (ref 65–99)
Potassium: 4.6 mmol/L (ref 3.5–5.2)
Sodium: 141 mmol/L (ref 134–144)
Total Protein: 7.3 g/dL (ref 6.0–8.5)

## 2020-06-27 LAB — CBC WITH DIFFERENTIAL/PLATELET
Basophils Absolute: 0.1 10*3/uL (ref 0.0–0.2)
Basos: 1 %
EOS (ABSOLUTE): 0.2 10*3/uL (ref 0.0–0.4)
Eos: 2 %
Hematocrit: 43.5 % (ref 34.0–46.6)
Hemoglobin: 14.2 g/dL (ref 11.1–15.9)
Immature Grans (Abs): 0 10*3/uL (ref 0.0–0.1)
Immature Granulocytes: 0 %
Lymphocytes Absolute: 2.2 10*3/uL (ref 0.7–3.1)
Lymphs: 27 %
MCH: 27.8 pg (ref 26.6–33.0)
MCHC: 32.6 g/dL (ref 31.5–35.7)
MCV: 85 fL (ref 79–97)
Monocytes Absolute: 0.5 10*3/uL (ref 0.1–0.9)
Monocytes: 7 %
Neutrophils Absolute: 5.2 10*3/uL (ref 1.4–7.0)
Neutrophils: 63 %
Platelets: 330 10*3/uL (ref 150–450)
RBC: 5.1 x10E6/uL (ref 3.77–5.28)
RDW: 13.4 % (ref 11.7–15.4)
WBC: 8.2 10*3/uL (ref 3.4–10.8)

## 2020-06-27 LAB — H PYLORI, IGM, IGG, IGA AB
H pylori, IgM Abs: <9 units (ref 0.0–8.9)
H. pylori, IgA Abs: <9 units (ref 0.0–8.9)
H. pylori, IgG AbS: 0.33 Index Value (ref 0.00–0.79)

## 2020-06-27 NOTE — Progress Notes (Signed)
CBC: normal; CMP: normal; Awaiting [place] results and H-PYLORI results

## 2020-06-29 ENCOUNTER — Encounter: Payer: Self-pay | Admitting: Nurse Practitioner

## 2020-07-03 ENCOUNTER — Other Ambulatory Visit: Payer: Self-pay | Admitting: Nurse Practitioner

## 2020-07-03 DIAGNOSIS — K8020 Calculus of gallbladder without cholecystitis without obstruction: Secondary | ICD-10-CM

## 2020-11-13 ENCOUNTER — Other Ambulatory Visit: Payer: Self-pay

## 2020-11-13 ENCOUNTER — Ambulatory Visit: Payer: 59 | Admitting: Family Medicine

## 2020-11-13 VITALS — BP 118/78 | HR 84 | Temp 96.3°F | Resp 16 | Ht 64.5 in | Wt 266.0 lb

## 2020-11-13 DIAGNOSIS — R1032 Left lower quadrant pain: Secondary | ICD-10-CM

## 2020-11-13 DIAGNOSIS — R102 Pelvic and perineal pain: Secondary | ICD-10-CM

## 2020-11-13 LAB — POCT URINALYSIS DIP (CLINITEK)
Bilirubin, UA: NEGATIVE
Blood, UA: NEGATIVE
Glucose, UA: NEGATIVE mg/dL
Ketones, POC UA: NEGATIVE mg/dL
Leukocytes, UA: NEGATIVE
Nitrite, UA: NEGATIVE
POC PROTEIN,UA: NEGATIVE
Spec Grav, UA: 1.02 (ref 1.010–1.025)
Urobilinogen, UA: 0.2 E.U./dL
pH, UA: 5.5 (ref 5.0–8.0)

## 2020-11-13 MED ORDER — CIPROFLOXACIN HCL 500 MG PO TABS: 500.0000 mg | ORAL_TABLET | Freq: Two times a day (BID) | ORAL | 0 refills | Status: DC
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
## 2020-11-14 LAB — CBC WITH DIFFERENTIAL/PLATELET
Basophils Absolute: 0.1 10*3/uL (ref 0.0–0.2)
Basos: 1 %
EOS (ABSOLUTE): 0.2 10*3/uL (ref 0.0–0.4)
Eos: 2 %
Hematocrit: 39.7 % (ref 34.0–46.6)
Hemoglobin: 13.4 g/dL (ref 11.1–15.9)
Immature Grans (Abs): 0 10*3/uL (ref 0.0–0.1)
Immature Granulocytes: 0 %
Lymphocytes Absolute: 2.4 10*3/uL (ref 0.7–3.1)
Lymphs: 19 %
MCH: 28.6 pg (ref 26.6–33.0)
MCHC: 33.8 g/dL (ref 31.5–35.7)
MCV: 85 fL (ref 79–97)
Monocytes Absolute: 1 10*3/uL — ABNORMAL HIGH (ref 0.1–0.9)
Monocytes: 8 %
Neutrophils Absolute: 8.6 10*3/uL — ABNORMAL HIGH (ref 1.4–7.0)
Neutrophils: 70 %
Platelets: 298 10*3/uL (ref 150–450)
RBC: 4.69 x10E6/uL (ref 3.77–5.28)
RDW: 13.6 % (ref 11.7–15.4)
WBC: 12.2 10*3/uL — ABNORMAL HIGH (ref 3.4–10.8)

## 2020-11-14 LAB — COMPREHENSIVE METABOLIC PANEL
ALT: 27 IU/L (ref 0–32)
AST: 16 IU/L (ref 0–40)
Albumin/Globulin Ratio: 1.5 (ref 1.2–2.2)
Albumin: 4.1 g/dL (ref 3.8–4.9)
Alkaline Phosphatase: 100 IU/L (ref 44–121)
BUN/Creatinine Ratio: 16 (ref 9–23)
BUN: 14 mg/dL (ref 6–24)
Bilirubin Total: 0.5 mg/dL (ref 0.0–1.2)
CO2: 20 mmol/L (ref 20–29)
Calcium: 9.1 mg/dL (ref 8.7–10.2)
Chloride: 103 mmol/L (ref 96–106)
Creatinine, Ser: 0.89 mg/dL (ref 0.57–1.00)
Globulin, Total: 2.8 g/dL (ref 1.5–4.5)
Glucose: 106 mg/dL — ABNORMAL HIGH (ref 65–99)
Potassium: 4.5 mmol/L (ref 3.5–5.2)
Sodium: 141 mmol/L (ref 134–144)
Total Protein: 6.9 g/dL (ref 6.0–8.5)
eGFR: 76 mL/min/{1.73_m2} (ref >59)

## 2020-12-01 ENCOUNTER — Encounter: Payer: Self-pay | Admitting: Family Medicine

## 2021-07-17 ENCOUNTER — Ambulatory Visit: Payer: 59 | Admitting: Orthopaedic Surgery

## 2021-07-17 ENCOUNTER — Other Ambulatory Visit: Payer: Self-pay

## 2021-07-17 ENCOUNTER — Encounter: Payer: Self-pay | Admitting: Orthopaedic Surgery

## 2021-07-17 ENCOUNTER — Ambulatory Visit: Payer: Self-pay

## 2021-07-17 DIAGNOSIS — M25562 Pain in left knee: Secondary | ICD-10-CM | POA: Diagnosis not present

## 2021-07-17 DIAGNOSIS — G8929 Other chronic pain: Secondary | ICD-10-CM

## 2021-07-17 MED ORDER — LIDOCAINE HCL 1 % IJ SOLN
4.0000 mL | INTRAMUSCULAR | Status: AC | PRN
  Administered 2021-07-17: 4 mL

## 2021-07-17 MED ORDER — METHYLPREDNISOLONE ACETATE 40 MG/ML IJ SUSP
80.0000 mg | INTRAMUSCULAR | Status: AC | PRN
Start: 2021-07-17 — End: 2021-07-17
  Administered 2021-07-17: 80 mg via INTRA_ARTICULAR
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
## 2021-07-18 ENCOUNTER — Encounter: Payer: Self-pay | Admitting: Family Medicine

## 2021-07-18 ENCOUNTER — Ambulatory Visit: Payer: 59 | Admitting: Family Medicine

## 2021-07-18 VITALS — BP 112/76 | HR 84 | Temp 96.8°F | Resp 16 | Ht 65.0 in | Wt 282.0 lb

## 2021-07-18 DIAGNOSIS — R600 Localized edema: Secondary | ICD-10-CM | POA: Diagnosis not present
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
## 2021-07-19 ENCOUNTER — Ambulatory Visit
Admission: RE | Admit: 2021-07-19 | Discharge: 2021-07-19 | Disposition: A | Discharge status: Home / Self Care | Payer: 59 | Source: Ambulatory Visit | Attending: Family Medicine | Admitting: Family Medicine

## 2021-07-19 DIAGNOSIS — R600 Localized edema: Secondary | ICD-10-CM

## 2021-07-20 DIAGNOSIS — R600 Localized edema: Secondary | ICD-10-CM | POA: Insufficient documentation

## 2021-07-20 NOTE — Assessment & Plan Note (Addendum)
Ordered venous ultrasound on left leg for DVT stat. Consider referral to lymphedema center and/or left arm compression sleeve.

## 2021-07-31 ENCOUNTER — Encounter: Payer: Self-pay | Admitting: Family Medicine

## 2021-08-14 ENCOUNTER — Other Ambulatory Visit: Payer: Self-pay | Admitting: Family Medicine

## 2021-08-14 ENCOUNTER — Telehealth: Payer: Self-pay

## 2021-08-14 DIAGNOSIS — R600 Localized edema: Secondary | ICD-10-CM

## 2021-08-14 NOTE — Telephone Encounter (Signed)
Patient calling requesting update of referral to lymphedema center. Do not see referral placed. Please advise.   Laura Waters, [place] 08/14/21 11:23 AM

## 2021-09-16 ENCOUNTER — Other Ambulatory Visit: Payer: Self-pay

## 2021-09-16 ENCOUNTER — Ambulatory Visit (HOSPITAL_COMMUNITY): Payer: 59 | Attending: Family Medicine | Admitting: Physical Therapy

## 2021-09-16 ENCOUNTER — Encounter (HOSPITAL_COMMUNITY): Payer: Self-pay | Admitting: Physical Therapy

## 2021-09-16 DIAGNOSIS — I890 Lymphedema, not elsewhere classified: Secondary | ICD-10-CM | POA: Insufficient documentation

## 2021-09-16 DIAGNOSIS — R600 Localized edema: Secondary | ICD-10-CM | POA: Diagnosis not present
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
## 2021-09-18 ENCOUNTER — Ambulatory Visit (HOSPITAL_COMMUNITY): Payer: 59 | Admitting: Physical Therapy

## 2021-09-19 ENCOUNTER — Other Ambulatory Visit: Payer: Self-pay

## 2021-09-19 ENCOUNTER — Ambulatory Visit: Payer: 59 | Admitting: Family Medicine

## 2021-09-19 VITALS — BP 126/74 | HR 83 | Temp 96.3°F | Ht 64.5 in | Wt 281.0 lb

## 2021-09-19 DIAGNOSIS — R600 Localized edema: Secondary | ICD-10-CM | POA: Diagnosis not present

## 2021-09-19 DIAGNOSIS — M25552 Pain in left hip: Secondary | ICD-10-CM

## 2021-09-19 DIAGNOSIS — K219 Gastro-esophageal reflux disease without esophagitis: Secondary | ICD-10-CM

## 2021-09-19 DIAGNOSIS — R7301 Impaired fasting glucose: Secondary | ICD-10-CM | POA: Diagnosis not present

## 2021-09-19 DIAGNOSIS — Z6841 Body Mass Index (BMI) 40.0 and over, adult: Secondary | ICD-10-CM

## 2021-09-19 DIAGNOSIS — M25562 Pain in left knee: Secondary | ICD-10-CM

## 2021-09-19 LAB — D-DIMER, QUANTITATIVE: D-DIMER: 0.38 mg/L FEU (ref 0.00–0.49)

## 2021-09-19 MED ORDER — MELOXICAM 7.5 MG PO TABS: 7.5000 mg | ORAL_TABLET | Freq: Every day | ORAL | 0 refills | Status: DC

## 2021-09-19 MED ORDER — PHENTERMINE HCL 37.5 MG PO CAPS: 37.5000 mg | ORAL_CAPSULE | ORAL | 0 refills | Status: DC

## 2021-09-19 MED ORDER — OMEPRAZOLE 40 MG PO CPDR: 40.0000 mg | DELAYED_RELEASE_CAPSULE | Freq: Every day | ORAL | 0 refills | Status: DC

## 2021-09-19 NOTE — Patient Instructions (Addendum)
Start on phentermine 37.5 mg once daily.  Start on meloxicam 7.5 mg once daily.  Increase on prilosec 40 mg once daily.
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
## 2021-09-20 ENCOUNTER — Ambulatory Visit (HOSPITAL_COMMUNITY): Payer: 59 | Admitting: Physical Therapy

## 2021-09-20 LAB — COMPREHENSIVE METABOLIC PANEL
ALT: 32 IU/L (ref 0–32)
AST: 20 IU/L (ref 0–40)
Albumin/Globulin Ratio: 1.4 (ref 1.2–2.2)
Albumin: 4.2 g/dL (ref 3.8–4.9)
Alkaline Phosphatase: 104 IU/L (ref 44–121)
BUN/Creatinine Ratio: 20 (ref 9–23)
BUN: 18 mg/dL (ref 6–24)
Bilirubin Total: <0.2 mg/dL (ref 0.0–1.2)
CO2: 22 mmol/L (ref 20–29)
Calcium: 9.7 mg/dL (ref 8.7–10.2)
Chloride: 103 mmol/L (ref 96–106)
Creatinine, Ser: 0.88 mg/dL (ref 0.57–1.00)
Globulin, Total: 2.9 g/dL (ref 1.5–4.5)
Glucose: 100 mg/dL — ABNORMAL HIGH (ref 70–99)
Potassium: 4.7 mmol/L (ref 3.5–5.2)
Sodium: 140 mmol/L (ref 134–144)
Total Protein: 7.1 g/dL (ref 6.0–8.5)
eGFR: 76 mL/min/{1.73_m2} (ref >59)

## 2021-09-20 LAB — CBC WITH DIFFERENTIAL/PLATELET
Basophils Absolute: 0.1 10*3/uL (ref 0.0–0.2)
Basos: 1 %
EOS (ABSOLUTE): 0.3 10*3/uL (ref 0.0–0.4)
Eos: 4 %
Hematocrit: 42.1 % (ref 34.0–46.6)
Hemoglobin: 14.3 g/dL (ref 11.1–15.9)
Immature Grans (Abs): 0 10*3/uL (ref 0.0–0.1)
Immature Granulocytes: 0 %
Lymphocytes Absolute: 2.4 10*3/uL (ref 0.7–3.1)
Lymphs: 32 %
MCH: 28.5 pg (ref 26.6–33.0)
MCHC: 34 g/dL (ref 31.5–35.7)
MCV: 84 fL (ref 79–97)
Monocytes Absolute: 0.7 10*3/uL (ref 0.1–0.9)
Monocytes: 9 %
Neutrophils Absolute: 4.1 10*3/uL (ref 1.4–7.0)
Neutrophils: 54 %
Platelets: 317 10*3/uL (ref 150–450)
RBC: 5.01 x10E6/uL (ref 3.77–5.28)
RDW: 14.1 % (ref 11.7–15.4)
WBC: 7.6 10*3/uL (ref 3.4–10.8)

## 2021-09-20 LAB — TSH: TSH: 4.52 u[IU]/mL — ABNORMAL HIGH (ref 0.450–4.500)

## 2021-09-21 ENCOUNTER — Encounter: Payer: Self-pay | Admitting: Family Medicine

## 2021-09-21 DIAGNOSIS — K219 Gastro-esophageal reflux disease without esophagitis: Secondary | ICD-10-CM | POA: Insufficient documentation

## 2021-09-21 DIAGNOSIS — M25562 Pain in left knee: Secondary | ICD-10-CM | POA: Insufficient documentation

## 2021-09-21 DIAGNOSIS — M25552 Pain in left hip: Secondary | ICD-10-CM | POA: Insufficient documentation

## 2021-09-21 DIAGNOSIS — R7301 Impaired fasting glucose: Secondary | ICD-10-CM | POA: Insufficient documentation

## 2021-09-21 DIAGNOSIS — R600 Localized edema: Secondary | ICD-10-CM | POA: Insufficient documentation

## 2021-09-21 NOTE — Assessment & Plan Note (Signed)
Trial on meloxicam 7.5 mg once daily.  Consider xrays and possible knee injection.

## 2021-09-21 NOTE — Assessment & Plan Note (Signed)
Recommend continue to work on eating healthy diet and exercise. Glucose 100. Added on a1c.

## 2021-09-21 NOTE — Assessment & Plan Note (Addendum)
Checked d dimer. Was normal.  Her swelling may be due orthopedic issue, but also obesity is contributing.  Pt is seeing lymphedema center for her left arm edema which is secondary to left axillary disection.

## 2021-09-21 NOTE — Assessment & Plan Note (Signed)
Continue lymphedema center.

## 2021-09-21 NOTE — Assessment & Plan Note (Signed)
Trial on meloxicam 7.5 mg once daily.  Consider xrays

## 2021-09-21 NOTE — Assessment & Plan Note (Signed)
Increase omeprazole to 40 mg daily as she is having breakthrough reflux and also started her on meloxicam.

## 2021-09-21 NOTE — Assessment & Plan Note (Signed)
Education given.  Discussed barriers.  Start on phentermine 37.5 mg once daily. Reviewed side effects.  I am concerned about OA of left knee and left hip. This is likely cause of pain and swelling.  Unfortunately, pt is morbidly obese and would not be a candidate for surgery if we cannot help her get her bmi down to 40.  Follow up in 1 month.

## 2021-09-23 ENCOUNTER — Ambulatory Visit (HOSPITAL_COMMUNITY): Payer: 59 | Admitting: Physical Therapy

## 2021-09-23 ENCOUNTER — Other Ambulatory Visit: Payer: Self-pay

## 2021-09-23 DIAGNOSIS — R7989 Other specified abnormal findings of blood chemistry: Secondary | ICD-10-CM

## 2021-09-23 DIAGNOSIS — R7301 Impaired fasting glucose: Secondary | ICD-10-CM

## 2021-09-24 ENCOUNTER — Encounter: Payer: Self-pay | Admitting: Orthopaedic Surgery

## 2021-09-24 ENCOUNTER — Ambulatory Visit: Payer: Self-pay

## 2021-09-24 ENCOUNTER — Ambulatory Visit: Payer: 59

## 2021-09-24 ENCOUNTER — Other Ambulatory Visit: Payer: Self-pay

## 2021-09-24 ENCOUNTER — Ambulatory Visit: Payer: 59 | Admitting: Orthopaedic Surgery

## 2021-09-24 VITALS — Ht 64.5 in | Wt 280.0 lb

## 2021-09-24 DIAGNOSIS — M25552 Pain in left hip: Secondary | ICD-10-CM | POA: Diagnosis not present
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
## 2021-09-25 ENCOUNTER — Encounter (HOSPITAL_COMMUNITY): Payer: 59 | Admitting: Physical Therapy

## 2021-09-27 ENCOUNTER — Ambulatory Visit (HOSPITAL_COMMUNITY): Payer: 59

## 2021-09-30 ENCOUNTER — Ambulatory Visit (HOSPITAL_COMMUNITY): Payer: 59 | Admitting: Physical Therapy

## 2021-09-30 LAB — T4, FREE

## 2021-09-30 LAB — HGB A1C W/O EAG: Hgb A1c MFr Bld: 5.8 % — ABNORMAL HIGH (ref 4.8–5.6)

## 2021-09-30 LAB — SPECIMEN STATUS REPORT

## 2021-10-02 ENCOUNTER — Encounter (HOSPITAL_COMMUNITY): Payer: 59 | Admitting: Physical Therapy

## 2021-10-04 ENCOUNTER — Encounter (HOSPITAL_COMMUNITY): Payer: 59

## 2021-10-07 ENCOUNTER — Encounter (HOSPITAL_COMMUNITY): Payer: 59 | Admitting: Physical Therapy

## 2021-10-09 ENCOUNTER — Encounter (HOSPITAL_COMMUNITY): Payer: 59 | Admitting: Physical Therapy

## 2021-10-11 ENCOUNTER — Encounter (HOSPITAL_COMMUNITY): Payer: 59 | Admitting: Physical Therapy

## 2021-10-14 ENCOUNTER — Encounter (HOSPITAL_COMMUNITY): Payer: 59 | Admitting: Physical Therapy

## 2021-10-16 ENCOUNTER — Encounter (HOSPITAL_COMMUNITY): Payer: 59 | Admitting: Physical Therapy

## 2021-10-17 ENCOUNTER — Other Ambulatory Visit: Payer: Self-pay | Admitting: Family Medicine

## 2021-10-17 DIAGNOSIS — M25562 Pain in left knee: Secondary | ICD-10-CM

## 2021-10-17 DIAGNOSIS — M25552 Pain in left hip: Secondary | ICD-10-CM

## 2021-10-18 ENCOUNTER — Encounter (HOSPITAL_COMMUNITY): Payer: 59 | Admitting: Physical Therapy

## 2021-10-21 ENCOUNTER — Encounter (HOSPITAL_COMMUNITY): Payer: 59 | Admitting: Physical Therapy

## 2021-10-21 ENCOUNTER — Ambulatory Visit: Payer: 59 | Admitting: Nurse Practitioner

## 2021-10-21 ENCOUNTER — Encounter: Payer: Self-pay | Admitting: Nurse Practitioner

## 2021-10-21 ENCOUNTER — Other Ambulatory Visit: Payer: Self-pay

## 2021-10-21 VITALS — BP 142/96 | HR 88 | Temp 97.7°F | Ht 64.5 in | Wt 278.0 lb

## 2021-10-21 DIAGNOSIS — M25562 Pain in left knee: Secondary | ICD-10-CM

## 2021-10-21 DIAGNOSIS — R110 Nausea: Secondary | ICD-10-CM

## 2021-10-21 DIAGNOSIS — R7303 Prediabetes: Secondary | ICD-10-CM

## 2021-10-21 DIAGNOSIS — R079 Chest pain, unspecified: Secondary | ICD-10-CM

## 2021-10-21 DIAGNOSIS — F900 Attention-deficit hyperactivity disorder, predominantly inattentive type: Secondary | ICD-10-CM

## 2021-10-21 DIAGNOSIS — Z6841 Body Mass Index (BMI) 40.0 and over, adult: Secondary | ICD-10-CM

## 2021-10-21 DIAGNOSIS — R7989 Other specified abnormal findings of blood chemistry: Secondary | ICD-10-CM

## 2021-10-21 DIAGNOSIS — R030 Elevated blood-pressure reading, without diagnosis of hypertension: Secondary | ICD-10-CM

## 2021-10-21 DIAGNOSIS — R9431 Abnormal electrocardiogram [ECG] [EKG]: Secondary | ICD-10-CM

## 2021-10-21 MED ORDER — ONDANSETRON HCL 4 MG PO TABS: 4.0000 mg | ORAL_TABLET | Freq: Three times a day (TID) | ORAL | 1 refills | Status: AC | PRN

## 2021-10-21 MED ORDER — OZEMPIC (0.25 OR 0.5 MG/DOSE) 2 MG/1.5ML ~~LOC~~ SOPN: 0.5000 mg | PEN_INJECTOR | SUBCUTANEOUS | 0 refills | Status: DC

## 2021-10-21 MED ORDER — OZEMPIC (0.25 OR 0.5 MG/DOSE) 2 MG/1.5ML ~~LOC~~ SOPN: 0.2500 mg | PEN_INJECTOR | SUBCUTANEOUS | 0 refills | Status: DC
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
## 2021-10-21 NOTE — Patient Instructions (Addendum)
We will call you with lab results and cardiology appt Seek emergency medical care for severe chest pain or any other concerning symptoms Begin Ozempic 0.25 mg injection weekly for 4 weeks, then increase to 0.5 mg injection weekly for 4 weeks, then increase to 1 mg injection for 4 weeks STOP Adipex Take Zofran 4 mg for nausea as needed Increase physical activity as tolerated Follow-up in 2-weeks for elevated BP  Exercises to do While Sitting Exercises that you do while sitting (chair exercises) can give you many of the same benefits as full exercise. Benefits include strengthening your heart, burning calories, and keeping muscles and joints healthy. Exercise can also improve your mood and help with depression and anxiety. You may benefit from chair exercises if you are unable to do standing exercises due to: Diabetic foot pain. Obesity. Illness. Arthritis. Recovery from surgery or injury. Breathing problems. Balance problems. Another type of disability. Before starting chair exercises, check with your health care provider or a physical therapist to find out how much exercise you can tolerate and which exercises are safe for you. If your health care provider approves: Start out slowly and build up over time. Aim to work up to about 10-20 minutes for each exercise session. Make exercise part of your daily routine. Drink water when you exercise. Do not wait until you are thirsty. Drink every 10-15 minutes. Stop exercising right away if you have pain, nausea, shortness of breath, or dizziness. If you are exercising in a wheelchair, make sure to lock the wheels. Ask your health care provider whether you can do tai chi or yoga. Many positions in these mind-body exercises can be modified to do while seated. Warm-up Before starting other exercises: Sit up as straight as you can. Have your knees bent at 90 degrees, which is the shape of the capital letter "L." Keep your feet flat on the floor. Sit  at the front edge of your chair, if you can. Pull in (tighten) the muscles in your abdomen and stretch your spine and neck as straight as you can. Hold this position for a few minutes. Breathe in and out evenly. Try to concentrate on your breathing, and relax your mind. Stretching Exercise A: Arm stretch Hold your arms out straight in front of your body. Bend your hands at the wrist with your fingers pointing up, as if signaling someone to stop. Notice the slight tension in your forearms as you hold the position. Keeping your arms out and your hands bent, rotate your hands outward as far as you can and hold this stretch. Aim to have your thumbs pointing up and your pinkie fingers pointing down. Slowly repeat arm stretches for one minute as tolerated. Exercise B: Leg stretch If you can move your legs, try to "draw" letters on the floor with the toes of your foot. Write your name with one foot. Write your name with the toes of your other foot. Slowly repeat the movements for one minute as tolerated. Exercise C: Reach for the sky Reach your hands as far over your head as you can to stretch your spine. Move your hands and arms as if you are climbing a rope. Slowly repeat the movements for one minute as tolerated. Range of motion exercises Exercise A: Shoulder roll Let your arms hang loosely at your sides. Lift just your shoulders up toward your ears, then let them relax back down. When your shoulders feel loose, rotate your shoulders in backward and forward circles. Do shoulder rolls  slowly for one minute as tolerated. Exercise B: March in place As if you are marching, pump your arms and lift your legs up and down. Lift your knees as high as you can. If you are unable to lift your knees, just pump your arms and move your ankles and feet up and down. March in place for one minute as tolerated. Exercise C: Seated jumping jacks Let your arms hang down straight. Keeping your arms straight, lift  them up over your head. Aim to point your fingers to the ceiling. While you lift your arms, straighten your legs and slide your heels along the floor to your sides, as wide as you can. As you bring your arms back down to your sides, slide your legs back together. If you are unable to use your legs, just move your arms. Slowly repeat seated jumping jacks for one minute as tolerated. Strengthening exercises Exercise A: Shoulder squeeze Hold your arms straight out from your body to your sides, with your elbows bent and your fists pointed at the ceiling. Keeping your arms in the bent position, move them forward so your elbows and forearms meet in front of your face. Open your arms back out as wide as you can with your elbows still bent, until you feel your shoulder blades squeezing together. Hold for 5 seconds. Slowly repeat the movements forward and backward for one minute as tolerated. Contact a health care provider if: You have to stop exercising due to any of the following: Pain. Nausea. Shortness of breath. Dizziness. Fatigue. You have significant pain or soreness after exercising. Get help right away if: You have chest pain. You have difficulty breathing. These symptoms may represent a serious problem that is an emergency. Do not wait to see if the symptoms will go away. Get medical help right away. Call your local emergency services (911 in the [place] Do not drive yourself to the hospital. Summary Exercises that you do while sitting (chair exercises) can strengthen your heart, burn calories, and keep muscles and joints healthy. You may benefit from chair exercises if you are unable to do standing exercises due to diabetic foot pain, obesity, recovery from surgery or injury, or other conditions. Before starting chair exercises, check with your health care provider or a physical therapist to find out how much exercise you can tolerate and which exercises are safe for you. This information  is not intended to replace advice given to you by your health care provider. Make sure you discuss any questions you have with your health care provider. Document Revised: 10/14/2020 Document Reviewed: 10/14/2020 Elsevier Patient Education  2022 [place].  Prediabetes Prediabetes is when your blood sugar (blood glucose) level is higher than normal but not high enough for you to be diagnosed with type 2 diabetes. Having prediabetes puts you at risk for developing type 2 diabetes (type 2 diabetes mellitus). With certain lifestyle changes, you may be able to prevent or delay the onset of type 2 diabetes. This is important because type 2 diabetes can lead to serious complications, such as: Heart disease. Stroke. Blindness. Kidney disease. Depression. Poor circulation in the feet and legs. In severe cases, this could lead to surgical removal of a leg (amputation). What are the causes? The exact cause of prediabetes is not known. It may result from insulin resistance. Insulin resistance develops when cells in the body do not respond properly to insulin that the body makes. This can cause excess glucose to build up in the blood.  High blood glucose (hyperglycemia) can develop. What increases the risk? The following factors may make you more likely to develop this condition: You have a family member with type 2 diabetes. You are older than 45 years. You had a temporary form of diabetes during a pregnancy (gestational diabetes). You had polycystic ovary syndrome (PCOS). You are overweight or obese. You are inactive (sedentary). You have a history of heart disease, including problems with cholesterol levels, high levels of blood fats, or high blood pressure. What are the signs or symptoms? You may have no symptoms. If you do have symptoms, they may include: Increased hunger. Increased thirst. Increased urination. Vision changes, such as blurry vision. Tiredness (fatigue). How is this  diagnosed? This condition can be diagnosed with blood tests. Your blood glucose may be checked with one or more of the following tests: A fasting blood glucose (FBG) test. You will not be allowed to eat (you will fast) for at least 8 hours before a blood sample is taken. An A1C blood test (hemoglobin A1C). This test provides information about blood glucose levels over the previous 2?3 months. An oral glucose tolerance test (OGTT). This test measures your blood glucose at two points in time: After fasting. This is your baseline level. Two hours after you drink a beverage that contains glucose. You may be diagnosed with prediabetes if: Your FBG is 100?125 mg/dL (5.6-6.9 mmol/L). Your A1C level is 5.7?6.4% (39-46 mmol/mol). Your OGTT result is 140?199 mg/dL (7.8-11 mmol/L). These blood tests may be repeated to confirm your diagnosis. How is this treated? Treatment may include dietary and lifestyle changes to help lower your blood glucose and prevent type 2 diabetes from developing. In some cases, medicine may be prescribed to help lower the risk of type 2 diabetes. Follow these instructions at home: Nutrition  Follow a healthy meal plan. This includes eating lean proteins, whole grains, legumes, fresh fruits and vegetables, low-fat dairy products, and healthy fats. Follow instructions from your health care provider about eating or drinking restrictions. Meet with a dietitian to create a healthy eating plan that is right for you. Lifestyle Do moderate-intensity exercise for at least 30 minutes a day on 5 or more days each week, or as told by your health care provider. A mix of activities may be best, such as: Brisk walking, swimming, biking, and weight lifting. Lose weight as told by your health care provider. Losing 5-7% of your body weight can reverse insulin resistance. Do not drink alcohol if: Your health care provider tells you not to drink. You are pregnant, may be pregnant, or are  planning to become pregnant. If you drink alcohol: Limit how much you use to: 0-1 drink a day for women. 0-2 drinks a day for men. Be aware of how much alcohol is in your drink. In the [place] one drink equals one 12 oz bottle of beer (355 mL), one 5 oz glass of wine (148 mL), or one 1 oz glass of hard liquor (44 mL). General instructions Take over-the-counter and prescription medicines only as told by your health care provider. You may be prescribed medicines that help lower the risk of type 2 diabetes. Do not use any products that contain nicotine or tobacco, such as cigarettes, e-cigarettes, and chewing tobacco. If you need help quitting, ask your health care provider. Keep all follow-up visits. This is important. Where to find more information American Diabetes Association: www.diabetes.org Academy of Nutrition and Dietetics: www.eatright.org American Heart Association: www.heart.org Contact a health care provider  if: You have any of these symptoms: Increased hunger. Increased urination. Increased thirst. Fatigue. Vision changes, such as blurry vision. Get help right away if you: Have shortness of breath. Feel confused. Vomit or feel like you may vomit. Summary Prediabetes is when your blood sugar (blood glucose)level is higher than normal but not high enough for you to be diagnosed with type 2 diabetes. Having prediabetes puts you at risk for developing type 2 diabetes (type 2 diabetes mellitus). Make lifestyle changes such as eating a healthy diet and exercising regularly to help prevent diabetes. Lose weight as told by your health care provider. This information is not intended to replace advice given to you by your health care provider. Make sure you discuss any questions you have with your health care provider. Document Revised: 11/17/2019 Document Reviewed: 11/17/2019 Elsevier Patient Education  2022 [place].   Prediabetes Eating Plan Prediabetes is a condition that  causes blood sugar (glucose) levels to be higher than normal. This increases the risk for developing type 2 diabetes (type 2 diabetes mellitus). Working with a health care provider or nutrition specialist (dietitian) to make diet and lifestyle changes can help prevent the onset of diabetes. These changes may help you: Control your blood glucose levels. Improve your cholesterol levels. Manage your blood pressure. What are tips for following this plan? Reading food labels Read food labels to check the amount of fat, salt (sodium), and sugar in prepackaged foods. Avoid foods that have: Saturated fats. Trans fats. Added sugars. Avoid foods that have more than 300 milligrams (mg) of sodium per serving. Limit your sodium intake to less than 2,300 mg each day. Shopping Avoid buying pre-made and processed foods. Avoid buying drinks with added sugar. Cooking Cook with olive oil. Do not use butter, lard, or ghee. Bake, broil, grill, steam, or boil foods. Avoid frying. Meal planning  Work with your dietitian to create an eating plan that is right for you. This may include tracking how many calories you take in each day. Use a food diary, notebook, or mobile application to track what you eat at each meal. Consider following a Mediterranean diet. This includes: Eating several servings of fresh fruits and vegetables each day. Eating fish at least twice a week. Eating one serving each day of whole grains, beans, nuts, and seeds. Using olive oil instead of other fats. Limiting alcohol. Limiting red meat. Using nonfat or low-fat dairy products. Consider following a plant-based diet. This includes dietary choices that focus on eating mostly vegetables and fruit, grains, beans, nuts, and seeds. If you have high blood pressure, you may need to limit your sodium intake or follow a diet such as the DASH (Dietary Approaches to Stop Hypertension) eating plan. The DASH diet aims to lower high blood  pressure. Lifestyle Set weight loss goals with help from your health care team. It is recommended that most people with prediabetes lose 7% of their body weight. Exercise for at least 30 minutes 5 or more days a week. Attend a support group or seek support from a mental health counselor. Take over-the-counter and prescription medicines only as told by your health care provider. What foods are recommended? Fruits Berries. Bananas. Apples. Oranges. Grapes. Papaya. Mango. Pomegranate. Kiwi. Grapefruit. Cherries. Vegetables Lettuce. Spinach. Peas. Beets. Cauliflower. Cabbage. Broccoli. Carrots. Tomatoes. Squash. Eggplant. Herbs. Peppers. Onions. Cucumbers. Brussels sprouts. Grains Whole grains, such as whole-wheat or whole-grain breads, crackers, cereals, and pasta. Unsweetened oatmeal. Bulgur. Barley. Quinoa. Brown rice. Corn or whole-wheat flour tortillas or taco  shells. Meats and other proteins Seafood. Poultry without skin. Lean cuts of pork and beef. Tofu. Eggs. Nuts. Beans. Dairy Low-fat or fat-free dairy products, such as yogurt, cottage cheese, and cheese. Beverages Water. Tea. Coffee. Sugar-free or diet soda. Seltzer water. Low-fat or nonfat milk. Milk alternatives, such as soy or almond milk. Fats and oils Olive oil. Canola oil. Sunflower oil. Grapeseed oil. Avocado. Walnuts. Sweets and desserts Sugar-free or low-fat pudding. Sugar-free or low-fat ice cream and other frozen treats. Seasonings and condiments Herbs. Sodium-free spices. Mustard. Relish. Low-salt, low-sugar ketchup. Low-salt, low-sugar barbecue sauce. Low-fat or fat-free mayonnaise. The items listed above may not be a complete list of recommended foods and beverages. Contact a dietitian for more information. What foods are not recommended? Fruits Fruits canned with syrup. Vegetables Canned vegetables. Frozen vegetables with butter or cream sauce. Grains Refined white flour and flour products, such as bread, pasta,  snack foods, and cereals. Meats and other proteins Fatty cuts of meat. Poultry with skin. Breaded or fried meat. Processed meats. Dairy Full-fat yogurt, cheese, or milk. Beverages Sweetened drinks, such as iced tea and soda. Fats and oils Butter. Lard. Ghee. Sweets and desserts Baked goods, such as cake, cupcakes, pastries, cookies, and cheesecake. Seasonings and condiments Spice mixes with added salt. Ketchup. Barbecue sauce. Mayonnaise. The items listed above may not be a complete list of foods and beverages that are not recommended. Contact a dietitian for more information. Where to find more information American Diabetes Association: www.diabetes.org Summary You may need to make diet and lifestyle changes to help prevent the onset of diabetes. These changes can help you control blood sugar, improve cholesterol levels, and manage blood pressure. Set weight loss goals with help from your health care team. It is recommended that most people with prediabetes lose 7% of their body weight. Consider following a Mediterranean diet. This includes eating plenty of fresh fruits and vegetables, whole grains, beans, nuts, seeds, fish, and low-fat dairy, and using olive oil instead of other fats. This information is not intended to replace advice given to you by your health care provider. Make sure you discuss any questions you have with your health care provider. Document Revised: 11/17/2019 Document Reviewed: 11/17/2019 Elsevier Patient Education  [place].

## 2021-10-22 ENCOUNTER — Telehealth: Payer: Self-pay

## 2021-10-22 ENCOUNTER — Other Ambulatory Visit: Payer: Self-pay | Admitting: Nurse Practitioner

## 2021-10-22 DIAGNOSIS — Z6841 Body Mass Index (BMI) 40.0 and over, adult: Secondary | ICD-10-CM

## 2021-10-22 LAB — LIPID PANEL
Chol/HDL Ratio: 2.8 ratio (ref 0.0–4.4)
Cholesterol, Total: 141 mg/dL (ref 100–199)
HDL: 50 mg/dL (ref >39)
LDL Chol Calc (NIH): 71 mg/dL (ref 0–99)
Triglycerides: 113 mg/dL (ref 0–149)
VLDL Cholesterol Cal: 20 mg/dL (ref 5–40)

## 2021-10-22 LAB — CARDIOVASCULAR RISK ASSESSMENT

## 2021-10-22 LAB — T4, FREE: Free T4: 1.32 ng/dL (ref 0.82–1.77)

## 2021-10-22 MED ORDER — SEMAGLUTIDE-WEIGHT MANAGEMENT 0.25 MG/0.5ML ~~LOC~~ SOAJ: 0.2500 mg | SUBCUTANEOUS | 0 refills | Status: DC

## 2021-10-22 MED ORDER — SEMAGLUTIDE-WEIGHT MANAGEMENT 1.7 MG/0.75ML ~~LOC~~ SOAJ: 1.7000 mg | SUBCUTANEOUS | 0 refills | Status: DC

## 2021-10-22 MED ORDER — SEMAGLUTIDE-WEIGHT MANAGEMENT 1 MG/0.5ML ~~LOC~~ SOAJ: 1.0000 mg | SUBCUTANEOUS | 0 refills | Status: DC

## 2021-10-22 MED ORDER — SEMAGLUTIDE-WEIGHT MANAGEMENT 0.5 MG/0.5ML ~~LOC~~ SOAJ: 0.5000 mg | SUBCUTANEOUS | 0 refills | Status: DC

## 2021-10-22 MED ORDER — SEMAGLUTIDE-WEIGHT MANAGEMENT 2.4 MG/0.75ML ~~LOC~~ SOAJ: 2.4000 mg | SUBCUTANEOUS | 0 refills | Status: DC

## 2021-10-22 NOTE — Telephone Encounter (Signed)
PA submitted and denied via covermymeds for Ozempic 0.25mg  determination reasoning is as follows: "The request for coverage for OZEMPIC INJ 2/1.5ML, use as directed (1 per month), is denied. This decision is based on health plan criteria for OZEMPIC INJ 2/1.5ML. This medicine is covered only if: All of the following: (1) You have a diagnosis of type 2 diabetes mellitus confirmed by accepted laboratory testing methodologies per treatment guidelines (for example, A1C greater than or equal to 6.5%, fasting plasma glucose greater than or equal to 126mg /dL and/or 2-hour plasma glucose greater than or equal to 200mg /dL). (2) The use of the medication is not intended solely for the purpose of weight loss. (3) You have a history of suboptimal response, contraindication or intolerance to metformin (generic Glucophage, Glucophage XR). The information provided does not show that you meet the criteria listed above"

## 2021-10-23 ENCOUNTER — Encounter (HOSPITAL_COMMUNITY): Payer: 59 | Admitting: Physical Therapy

## 2021-10-25 ENCOUNTER — Encounter (HOSPITAL_COMMUNITY): Payer: 59 | Admitting: Physical Therapy

## 2021-11-04 ENCOUNTER — Ambulatory Visit: Payer: 59 | Admitting: Nurse Practitioner

## 2021-11-04 ENCOUNTER — Encounter: Payer: Self-pay | Admitting: Nurse Practitioner

## 2021-11-04 ENCOUNTER — Other Ambulatory Visit: Payer: Self-pay

## 2021-11-04 VITALS — BP 142/78 | HR 77 | Temp 96.3°F | Ht 64.0 in | Wt 275.0 lb

## 2021-11-04 DIAGNOSIS — R030 Elevated blood-pressure reading, without diagnosis of hypertension: Secondary | ICD-10-CM

## 2021-11-04 DIAGNOSIS — R7303 Prediabetes: Secondary | ICD-10-CM | POA: Diagnosis not present

## 2021-11-04 DIAGNOSIS — L304 Erythema intertrigo: Secondary | ICD-10-CM | POA: Diagnosis not present

## 2021-11-04 DIAGNOSIS — Z6841 Body Mass Index (BMI) 40.0 and over, adult: Secondary | ICD-10-CM

## 2021-11-04 MED ORDER — SEMAGLUTIDE-WEIGHT MANAGEMENT 0.25 MG/0.5ML ~~LOC~~ SOAJ: 0.5000 mg | SUBCUTANEOUS | 0 refills | Status: DC

## 2021-11-04 MED ORDER — SEMAGLUTIDE-WEIGHT MANAGEMENT 1.7 MG/0.75ML ~~LOC~~ SOAJ
1.7000 mg | SUBCUTANEOUS | 0 refills | Status: DC
Start: 2022-01-30 — End: 2021-11-08

## 2021-11-04 MED ORDER — SEMAGLUTIDE (1 MG/DOSE) 4 MG/3ML ~~LOC~~ SOPN: 1.0000 mg | PEN_INJECTOR | SUBCUTANEOUS | 0 refills | Status: DC

## 2021-11-04 MED ORDER — NYSTATIN 100000 UNIT/GM EX POWD: 1.0000 "application " | Freq: Three times a day (TID) | CUTANEOUS | 2 refills | Status: DC

## 2021-11-04 MED ORDER — SEMAGLUTIDE-WEIGHT MANAGEMENT 0.5 MG/0.5ML ~~LOC~~ SOAJ: 0.5000 mg | SUBCUTANEOUS | 0 refills | Status: DC

## 2021-11-04 MED ORDER — SEMAGLUTIDE-WEIGHT MANAGEMENT 1 MG/0.5ML ~~LOC~~ SOAJ: 1.0000 mg | SUBCUTANEOUS | 0 refills | Status: DC

## 2021-11-04 MED ORDER — OZEMPIC (0.25 OR 0.5 MG/DOSE) 2 MG/1.5ML ~~LOC~~ SOPN: 0.5000 mg | PEN_INJECTOR | SUBCUTANEOUS | 0 refills | Status: DC

## 2021-11-04 MED ORDER — SEMAGLUTIDE-WEIGHT MANAGEMENT 2.4 MG/0.75ML ~~LOC~~ SOAJ: 2.4000 mg | SUBCUTANEOUS | 0 refills | Status: DC

## 2021-11-04 NOTE — Patient Instructions (Addendum)
Continue Semaglutide as directed Monitor BP, keep log Continue to eat a heart healthy diet and increase physical activity Follow-up in 62-month,fasting-bring BP log to next visit   [place] Dietary Approaches to Stop Hypertension. The DASH eating plan is a healthy eating plan that has been shown to: Reduce high blood pressure (hypertension). Reduce your risk for type 2 diabetes, heart disease, and stroke. Help with weight loss. What are tips for following this plan? Reading food labels Check food labels for the amount of salt (sodium) per serving. Choose foods with less than 5 percent of the Daily Value of sodium. Generally, foods with less than 300 milligrams (mg) of sodium per serving fit into this eating plan. To find whole grains, look for the word "whole" as the first word in the ingredient list. Shopping Buy products labeled as "low-sodium" or "no salt added." Buy fresh foods. Avoid canned foods and pre-made or frozen meals. Cooking Avoid adding salt when cooking. Use salt-free seasonings or herbs instead of table salt or sea salt. Check with your health care provider or pharmacist before using salt substitutes. Do not fry foods. Cook foods using healthy methods such as baking, boiling, grilling, roasting, and broiling instead. Cook with heart-healthy oils, such as olive, canola, avocado, soybean, or sunflower oil. Meal planning  Eat a balanced diet that includes: 4 or more servings of fruits and 4 or more servings of vegetables each day. Try to fill one-half of your plate with fruits and vegetables. 6-8 servings of whole grains each day. Less than 6 oz (170 g) of lean meat, poultry, or fish each day. A 3-oz (85-g) serving of meat is about the same size as a deck of cards. One egg equals 1 oz (28 g). 2-3 servings of low-fat dairy each day. One serving is 1 cup (237 mL). 1 serving of nuts, seeds, or beans 5 times each week. 2-3 servings of heart-healthy  fats. Healthy fats called omega-3 fatty acids are found in foods such as walnuts, flaxseeds, fortified milks, and eggs. These fats are also found in cold-water fish, such as sardines, salmon, and mackerel. Limit how much you eat of: Canned or prepackaged foods. Food that is high in trans fat, such as some fried foods. Food that is high in saturated fat, such as fatty meat. Desserts and other sweets, sugary drinks, and other foods with added sugar. Full-fat dairy products. Do not salt foods before eating. Do not eat more than 4 egg yolks a week. Try to eat at least 2 vegetarian meals a week. Eat more home-cooked food and less restaurant, buffet, and fast food. Lifestyle When eating at a restaurant, ask that your food be prepared with less salt or no salt, if possible. If you drink alcohol: Limit how much you use to: 0-1 drink a day for women who are not pregnant. 0-2 drinks a day for men. Be aware of how much alcohol is in your drink. In the [place] one drink equals one 12 oz bottle of beer (355 mL), one 5 oz glass of wine (148 mL), or one 1 oz glass of hard liquor (44 mL). General information Avoid eating more than 2,300 mg of salt a day. If you have hypertension, you may need to reduce your sodium intake to 1,500 mg a day. Work with your health care provider to maintain a healthy body weight or to lose weight. Ask what an ideal weight is for you. Get at least 30 minutes of exercise that  causes your heart to beat faster (aerobic exercise) most days of the week. Activities may include walking, swimming, or biking. Work with your health care provider or dietitian to adjust your eating plan to your individual calorie needs. What foods should I eat? Fruits All fresh, dried, or frozen fruit. Canned fruit in natural juice (without added sugar). Vegetables Fresh or frozen vegetables (raw, steamed, roasted, or grilled). Low-sodium or reduced-sodium tomato and vegetable juice. Low-sodium or  reduced-sodium tomato sauce and tomato paste. Low-sodium or reduced-sodium canned vegetables. Grains Whole-grain or whole-wheat bread. Whole-grain or whole-wheat pasta. Brown rice. [place]. Bulgur. Whole-grain and low-sodium cereals. Pita bread. Low-fat, low-sodium crackers. Whole-wheat flour tortillas. Meats and other proteins Skinless chicken or [place]. Ground chicken or [place]. Pork with fat trimmed off. Fish and seafood. Egg whites. Dried beans, peas, or lentils. Unsalted nuts, nut butters, and seeds. Unsalted canned beans. Lean cuts of beef with fat trimmed off. Low-sodium, lean precooked or cured meat, such as sausages or meat loaves. Dairy Low-fat (1%) or fat-free (skim) milk. Reduced-fat, low-fat, or fat-free cheeses. Nonfat, low-sodium ricotta or cottage cheese. Low-fat or nonfat yogurt. Low-fat, low-sodium cheese. Fats and oils Soft margarine without trans fats. Vegetable oil. Reduced-fat, low-fat, or light mayonnaise and salad dressings (reduced-sodium). Canola, safflower, olive, avocado, soybean, and sunflower oils. Avocado. Seasonings and condiments Herbs. Spices. Seasoning mixes without salt. Other foods Unsalted popcorn and pretzels. Fat-free sweets. The items listed above may not be a complete list of foods and beverages you can eat. Contact a dietitian for more information. What foods should I avoid? Fruits Canned fruit in a light or heavy syrup. Fried fruit. Fruit in cream or butter sauce. Vegetables Creamed or fried vegetables. Vegetables in a cheese sauce. Regular canned vegetables (not low-sodium or reduced-sodium). Regular canned tomato sauce and paste (not low-sodium or reduced-sodium). Regular tomato and vegetable juice (not low-sodium or reduced-sodium). Angie Fava. Olives. Grains Baked goods made with fat, such as croissants, muffins, or some breads. Dry pasta or rice meal packs. Meats and other proteins Fatty cuts of meat. Ribs. Fried meat. Berniece Salines. Bologna,  salami, and other precooked or cured meats, such as sausages or meat loaves. Fat from the back of a pig (fatback). Bratwurst. Salted nuts and seeds. Canned beans with added salt. Canned or smoked fish. Whole eggs or egg yolks. Chicken or [place] with skin. Dairy Whole or 2% milk, cream, and half-and-half. Whole or full-fat cream cheese. Whole-fat or sweetened yogurt. Full-fat cheese. Nondairy creamers. Whipped toppings. Processed cheese and cheese spreads. Fats and oils Butter. Stick margarine. Lard. Shortening. Ghee. Bacon fat. Tropical oils, such as coconut, palm kernel, or palm oil. Seasonings and condiments Onion salt, garlic salt, seasoned salt, table salt, and sea salt. Worcestershire sauce. Tartar sauce. Barbecue sauce. Teriyaki sauce. Soy sauce, including reduced-sodium. Steak sauce. Canned and packaged gravies. Fish sauce. Oyster sauce. Cocktail sauce. Store-bought horseradish. Ketchup. Mustard. Meat flavorings and tenderizers. Bouillon cubes. Hot sauces. Pre-made or packaged marinades. Pre-made or packaged taco seasonings. Relishes. Regular salad dressings. Other foods Salted popcorn and pretzels. The items listed above may not be a complete list of foods and beverages you should avoid. Contact a dietitian for more information. Where to find more information National Heart, Lung, and Blood Institute: https://wilson-eaton.com/ American Heart Association: www.heart.org Academy of Nutrition and Dietetics: www.eatright.[place]: www.kidney.org Summary The DASH eating plan is a healthy eating plan that has been shown to reduce high blood pressure (hypertension). It may also reduce your risk for type 2 diabetes, heart  disease, and stroke. When on the DASH eating plan, aim to eat more fresh fruits and vegetables, whole grains, lean proteins, low-fat dairy, and heart-healthy fats. With the DASH eating plan, you should limit salt (sodium) intake to 2,300 mg a day. If you have  hypertension, you may need to reduce your sodium intake to 1,500 mg a day. Work with your health care provider or dietitian to adjust your eating plan to your individual calorie needs. This information is not intended to replace advice given to you by your health care provider. Make sure you discuss any questions you have with your health care provider. Document Revised: 07/22/2019 Document Reviewed: 07/22/2019 Elsevier Patient Education  2022 [place].      Preventing Hypertension Hypertension, also called high blood pressure, is when the force of blood pumping through the arteries is too strong. Arteries are blood vessels that carry blood from the heart throughout the body. Often, hypertension does not cause symptoms until blood pressure is very high. It is important to have your blood pressure checked regularly. Diet and lifestyle changes can help you prevent hypertension, and they may make you feel better overall and improve your quality of life. If you already have hypertension, you may control it with diet and lifestyle changes, as well as with medicine. How can this condition affect me? Over time, hypertension can damage the arteries and decrease blood flow to important parts of the body, including the brain, heart, and kidneys. By keeping your blood pressure in a healthy range, you can help prevent complications like heart attack, heart failure, stroke, kidney failure, and vascular dementia. What can increase my risk? Being an older adult. Older people are more often affected. Having family members who have had high blood pressure. Being obese. Being female. Males are more likely to have high blood pressure. Drinking too much alcohol or caffeine. Smoking or using illegal drugs. Taking certain medicines, such as antidepressants, decongestants, birth control pills, and NSAIDs, such as ibuprofen. Having thyroid problems. Having certain tumors. What actions can I take to prevent or  manage this condition? Work with your health care provider to make a hypertension prevention plan that works for you. Follow your plan and keep all follow-up visits as told by your health care provider. Diet changes Maintain a healthy diet. This includes: Eating less salt (sodium). Ask your health care provider how much sodium is safe for you to have. The general recommendation is to have less than 1 tsp (2,300 mg) of sodium a day. Do not add salt to your food. Choose low-sodium options when grocery shopping and eating out. Limiting fats in your diet. You can do this by eating low-fat or fat-free dairy products and by eating less red meat. Eating more fruits, vegetables, and whole grains. Make a goal to eat: 1-2 cups of fresh fruits and vegetables each day. 3-4 servings of whole grains each day. Avoiding foods and beverages that have added sugars. Eating fish that contain healthy fats (omega-3 fatty acids), such as mackerel or salmon. If you need help putting together a healthy eating plan, try the DASH diet. This diet is high in fruits, vegetables, and whole grains. It is low in sodium, red meat, and added sugars. DASH stands for Dietary Approaches to Stop Hypertension. Lifestyle changes Lose weight if you are overweight. Losing just 3?5% of your body weight can help prevent or control hypertension. For example, if your present weight is 200 lb (91 kg), a loss of 3-5% of your weight  means losing 6-10 lb (2.7-4.5 kg). Ask your health care provider to help you with a diet and exercise plan to safely lose weight. Other recommendations usually include: Get enough exercise. Do at least 150 minutes of moderate-intensity exercise each week. You could do this in short exercise sessions several times a day, or you could do longer exercise sessions a few times a week. For example, you could take a brisk 10-minute walk or bike ride, 3 times a day, for 5 days a week. Find ways to reduce stress, such as  exercising, meditating, listening to music, or taking a yoga class. If you need help reducing stress, ask your health care provider. Do not use any products that contain nicotine or tobacco, such as cigarettes, e-cigarettes, and chewing tobacco. If you need help quitting, ask your health care provider. Chemicals in tobacco and nicotine products raise your blood pressure each time you use them. If you need help quitting, ask your health care provider. Learn how to check your blood pressure at home. Make sure that you know your personal target blood pressure, as told by your health care provider. Try to sleep 7-9 hours per night.  Alcohol use Do not drink alcohol if: Your health care provider tells you not to drink. You are pregnant, may be pregnant, or are planning to become pregnant. If you drink alcohol: Limit how much you use to: 0-1 drink a day for women. 0-2 drinks a day for men. Be aware of how much alcohol is in your drink. In the [place] one drink equals one 12 oz bottle of beer (355 mL), one 5 oz glass of wine (148 mL), or one 1 oz glass of hard liquor (44 mL). Medicines In addition to diet and lifestyle changes, your health care provider may recommend medicines to help lower your blood pressure. In general: You may need to try a few different medicines to find what works best for you. You may need to take more than one medicine. Take over-the-counter and prescription medicines only as told by your health care provider. Questions to ask your health care provider What is my blood pressure goal? How can I lower my risk for high blood pressure? How should I monitor my blood pressure at home? Where to find support Your health care provider can help you prevent hypertension and help you keep your blood pressure at a healthy level. Your local hospital or your community may also provide support services and prevention programs. The American Heart Association offers an online support network  at supportnetwork.heart.org Where to find more information Learn more about hypertension from: [place], Lung, and [place]: https://wilson-eaton.com/ Centers for Disease Control and Prevention: http://www.wolf.info/ American Academy of Family Physicians: familydoctor.org Learn more about the DASH diet from: [place], Lung, and [place]: https://wilson-eaton.com/ Contact a health care provider if: You think you are having a reaction to medicines you have taken. You have recurrent headaches or feel dizzy. You have swelling in your ankles. You have trouble with your vision. Get help right away if: You have sudden, severe chest, back, or abdominal pain or discomfort. You have shortness of breath. You have a sudden, severe headache. These symptoms may represent a serious problem that is an emergency. Do not wait to see if the symptoms will go away. Get medical help right away. Call your local emergency services (911 in the [place] Do not drive yourself to the hospital.  Summary Hypertension often does not cause any symptoms until blood pressure is  very high. It is important to get your blood pressure checked regularly. Diet and lifestyle changes are important steps in preventing hypertension. By keeping your blood pressure in a healthy range, you may prevent complications like heart attack, heart failure, stroke, and kidney failure. Work with your health care provider to make a hypertension prevention plan that works for you. This information is not intended to replace advice given to you by your health care provider. Make sure you discuss any questions you have with your health care provider. Document Revised: 07/19/2019 Document Reviewed: 07/19/2019 Elsevier Patient Education  2022 [place] Injection What is this medication? SEMAGLUTIDE (SEM a GLOO tide) treats type 2 diabetes. It works by increasing insulin levels in your body, which decreases your blood sugar (glucose).  It also reduces the amount of sugar released into the blood and slows down your digestion. It can also be used to lower the risk of heart attack and stroke in people with type 2 diabetes. Changes to diet and exercise are often combined with this medication. This medicine may be used for other purposes; ask your health care provider or pharmacist if you have questions. COMMON BRAND NAME(S): OZEMPIC What should I tell my care team before I take this medication? They need to know if you have any of these conditions: Endocrine tumors (MEN 2) or if someone in your family had these tumors Eye disease, vision problems History of pancreatitis Kidney disease Stomach problems Thyroid cancer or if someone in your family had thyroid cancer An unusual or allergic reaction to semaglutide, other medications, foods, dyes, or preservatives Pregnant or trying to get pregnant Breast-feeding How should I use this medication? This medication is for injection under the skin of your upper leg (thigh), stomach area, or upper arm. It is given once every week (every 7 days). You will be taught how to prepare and give this medication. Use exactly as directed. Take your medication at regular intervals. Do not take it more often than directed. If you use this medication with insulin, you should inject this medication and the insulin separately. Do not mix them together. Do not give the injections right next to each other. Change (rotate) injection sites with each injection. It is important that you put your used needles and syringes in a special sharps container. Do not put them in a trash can. If you do not have a sharps container, call your pharmacist or care team to get one. A special MedGuide will be given to you by the pharmacist with each prescription and refill. Be sure to read this information carefully each time. This medication comes with INSTRUCTIONS FOR USE. Ask your pharmacist for directions on how to use this  medication. Read the information carefully. Talk to your pharmacist or care team if you have questions. Talk to your care team about the use of this medication in children. Special care may be needed. Overdosage: If you think you have taken too much of this medicine contact a poison control center or emergency room at once. NOTE: This medicine is only for you. Do not share this medicine with others. What if I miss a dose? If you miss a dose, take it as soon as you can within 5 days after the missed dose. Then take your next dose at your regular weekly time. If it has been longer than 5 days after the missed dose, do not take the missed dose. Take the next dose at your regular time. Do not take  double or extra doses. If you have questions about a missed dose, contact your care team for advice. What may interact with this medication? Other medications for diabetes Many medications may cause changes in blood sugar, these include: Alcohol containing beverages Antiviral medications for HIV or AIDS Aspirin and aspirin-like medications Certain medications for blood pressure, heart disease, irregular heart beat Chromium Diuretics Female hormones, such as estrogens or progestins, birth control pills Fenofibrate Gemfibrozil Isoniazid Lanreotide Female hormones or anabolic steroids MAOIs like Carbex, Eldepryl, Marplan, Nardil, and Parnate Medications for weight loss Medications for allergies, asthma, cold, or cough Medications for depression, anxiety, or psychotic disturbances Niacin Nicotine NSAIDs, medications for pain and inflammation, like ibuprofen or naproxen Octreotide Pasireotide Pentamidine Phenytoin Probenecid Quinolone antibiotics such as ciprofloxacin, levofloxacin, ofloxacin Some herbal dietary supplements Steroid medications such as prednisone or cortisone Sulfamethoxazole; trimethoprim Thyroid hormones Some medications can hide the warning symptoms of low blood sugar  (hypoglycemia). You may need to monitor your blood sugar more closely if you are taking one of these medications. These include: Beta-blockers, often used for high blood pressure or heart problems (examples include atenolol, metoprolol, propranolol) Clonidine Guanethidine Reserpine This list may not describe all possible interactions. Give your health care provider a list of all the medicines, herbs, non-prescription drugs, or dietary supplements you use. Also tell them if you smoke, drink alcohol, or use illegal drugs. Some items may interact with your medicine. What should I watch for while using this medication? Visit your care team for regular checks on your progress. Drink plenty of fluids while taking this medication. Check with your care team if you get an attack of severe diarrhea, nausea, and vomiting. The loss of too much body fluid can make it dangerous for you to take this medication. A test called the HbA1C (A1C) will be monitored. This is a simple blood test. It measures your blood sugar control over the last 2 to 3 months. You will receive this test every 3 to 6 months. Learn how to check your blood sugar. Learn the symptoms of low and high blood sugar and how to manage them. Always carry a quick-source of sugar with you in case you have symptoms of low blood sugar. Examples include hard sugar candy or glucose tablets. Make sure others know that you can choke if you eat or drink when you develop serious symptoms of low blood sugar, such as seizures or unconsciousness. They must get medical help at once. Tell your care team if you have high blood sugar. You might need to change the dose of your medication. If you are sick or exercising more than usual, you might need to change the dose of your medication. Do not skip meals. Ask your care team if you should avoid alcohol. Many nonprescription cough and cold products contain sugar or alcohol. These can affect blood sugar. Pens should never  be shared. Even if the needle is changed, sharing may result in passing of viruses like hepatitis or HIV. Wear a medical ID bracelet or chain, and carry a card that describes your disease and details of your medication and dosage times. Do not become pregnant while taking this medication. Women should inform their care team if they wish to become pregnant or think they might be pregnant. There is a potential for serious side effects to an unborn child. Talk to your care team for more information. What side effects may I notice from receiving this medication? Side effects that you should report to your care  team as soon as possible: Allergic reactions--skin rash, itching, hives, swelling of the face, lips, tongue, or throat Change in vision Dehydration--increased thirst, dry mouth, feeling faint or lightheaded, headache, dark yellow or brown urine Gallbladder problems--severe stomach pain, nausea, vomiting, fever Heart palpitations--rapid, pounding, or irregular heartbeat Kidney injury--decrease in the amount of urine, swelling of the ankles, hands, or feet Pancreatitis--severe stomach pain that spreads to your back or gets worse after eating or when touched, fever, nausea, vomiting Thyroid cancer--new mass or lump in the neck, pain or trouble swallowing, trouble breathing, hoarseness Side effects that usually do not require medical attention (report to your care team if they continue or are bothersome): Diarrhea Loss of appetite Nausea Stomach pain Vomiting This list may not describe all possible side effects. Call your doctor for medical advice about side effects. You may report side effects to FDA at 1-800-FDA-1088. Where should I keep my medication? Keep out of the reach of children. Store unopened pens in a refrigerator between 2 and 8 degrees C (36 and 46 degrees F). Do not freeze. Protect from light and heat. After you first use the pen, it can be stored for 56 days at room temperature  between 15 and 30 degrees C (59 and 86 degrees F) or in a refrigerator. Throw away your used pen after 56 days or after the expiration date, whichever comes first. Do not store your pen with the needle attached. If the needle is left on, medication may leak from the pen. NOTE: This sheet is a summary. It may not cover all possible information. If you have questions about this medicine, talk to your doctor, pharmacist, or health care provider.  2022 Elsevier/Gold Standard (2020-11-22 00:00:00)
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
## 2021-11-08 ENCOUNTER — Telehealth: Payer: Self-pay

## 2021-11-08 NOTE — Telephone Encounter (Signed)
PA submitted and denied for [facility] via covermymeds. ?"The requested medication and/or diagnosis are not a covered benefit and excluded from coverage in accordance with the terms and conditions of your plan benefit. Therefore, the request has been ?administratively denied. The requested medication and/or diagnosis are not a covered benefit and are excluded from coverage in accordance with the terms and conditions of your plan benefit. Therefore, this request has been ?administratively denied. The reason(s) [facility] did not approve this medication can be found above. This denial is based on ?our [facility] drug coverage" ?

## 2021-11-15 ENCOUNTER — Other Ambulatory Visit: Payer: Self-pay | Admitting: Family Medicine

## 2021-11-15 DIAGNOSIS — M25562 Pain in left knee: Secondary | ICD-10-CM

## 2021-11-15 DIAGNOSIS — M25552 Pain in left hip: Secondary | ICD-10-CM

## 2021-11-15 NOTE — Telephone Encounter (Signed)
Refill sent to pharmacy.   

## 2021-11-18 ENCOUNTER — Telehealth: Payer: Self-pay

## 2021-11-18 ENCOUNTER — Other Ambulatory Visit: Payer: Self-pay | Admitting: Family Medicine

## 2021-11-18 MED ORDER — OZEMPIC (0.25 OR 0.5 MG/DOSE) 2 MG/1.5ML ~~LOC~~ SOPN: 0.5000 mg | PEN_INJECTOR | SUBCUTANEOUS | 0 refills | Status: DC

## 2021-11-18 NOTE — Telephone Encounter (Signed)
Patient left VM questioning if ozempic could be ordered. She is aware wegovy was denied. Requests this be sent to [facility]. Please advise.  ? ?Laura Waters 11/18/21 4:06 PM ? ?

## 2021-12-03 ENCOUNTER — Encounter: Payer: Self-pay | Admitting: Cardiology

## 2021-12-03 ENCOUNTER — Ambulatory Visit: Payer: 59 | Admitting: Cardiology

## 2021-12-03 VITALS — BP 126/90 | HR 87 | Ht 61.0 in | Wt 270.6 lb

## 2021-12-03 DIAGNOSIS — I453 Trifascicular block: Secondary | ICD-10-CM | POA: Diagnosis not present

## 2021-12-03 DIAGNOSIS — R0789 Other chest pain: Secondary | ICD-10-CM | POA: Insufficient documentation

## 2021-12-03 DIAGNOSIS — Z6841 Body Mass Index (BMI) 40.0 and over, adult: Secondary | ICD-10-CM | POA: Diagnosis not present

## 2021-12-03 DIAGNOSIS — R079 Chest pain, unspecified: Secondary | ICD-10-CM

## 2021-12-03 DIAGNOSIS — R0609 Other forms of dyspnea: Secondary | ICD-10-CM | POA: Diagnosis not present

## 2021-12-03 MED ORDER — ASPIRIN EC 81 MG PO TBEC: 81.0000 mg | DELAYED_RELEASE_TABLET | Freq: Every day | ORAL | 3 refills | Status: AC

## 2021-12-03 MED ORDER — METOPROLOL TARTRATE 100 MG PO TABS: 100.0000 mg | ORAL_TABLET | Freq: Once | ORAL | 0 refills | Status: DC

## 2021-12-03 NOTE — Patient Instructions (Signed)
Medication Instructions:  ?Your physician has recommended you make the following change in your medication:  ? ?START: Aspirin 81 mg daily ? ? ?*If you need a refill on your cardiac medications before your next appointment, please call your pharmacy* ? ? ?Lab Work: ? ?BMP 1 week before CT ? ?If you have labs (blood work) drawn today and your tests are completely normal, you will receive your results only by: ?MyChart Message (if you have MyChart) OR ?A paper copy in the mail ?If you have any lab test that is abnormal or we need to change your treatment, we will call you to review the results. ? ? ?Testing/Procedures: ?Your physician has requested that you have an echocardiogram. Echocardiography is a painless test that uses sound waves to create images of your heart. It provides your doctor with information about the size and shape of your heart and how well your heart?s chambers and valves are working. This procedure takes approximately one hour. There are no restrictions for this procedure. ? ? ? ?Your cardiac CT will be scheduled at one of the below locations:  ? ?[facility] ?[address] ?[place], [place] [address] ?[phone] ? ?OR ? ?[place] ?[address] ?[address] ?[place], [place] [address] ?[phone] ? ?If scheduled at [facility], please arrive at the [facility] (Entrance C2) of [facility] 30 minutes prior to test start time. ?You can use the FREE valet parking offered at entrance C (encouraged to control the heart rate for the test)  ?Proceed to the [facility] Radiology Department (first floor) to check-in and test prep. ? ?All radiology patients and guests should use entrance C2 at [facility], accessed from [facility], even though the hospital's physical address listed is [address]. ? ? ? ?If scheduled at [facility], please arrive 15 mins early  for check-in and test prep. ? ?Please follow these instructions carefully (unless otherwise directed): ? ?On the Night Before the Test: ?Be sure to Drink plenty of water. ?Do not consume any caffeinated/decaffeinated beverages or chocolate 12 hours prior to your test. ?Do not take any antihistamines 12 hours prior to your test. ? ?On the Day of the Test: ?Drink plenty of water until 1 hour prior to the test. ?Do not eat any food 4 hours prior to the test. ?You may take your regular medications prior to the test.  ?Take metoprolol (Lopressor) two hours prior to test. ?FEMALES- please wear underwire-free bra if available, avoid dresses & tight clothing ? ?     ?After the Test: ?Drink plenty of water. ?After receiving IV contrast, you may experience a mild flushed feeling. This is normal. ?On occasion, you may experience a mild rash up to 24 hours after the test. This is not dangerous. If this occurs, you can take Benadryl 25 mg and increase your fluid intake. ?If you experience trouble breathing, this can be serious. If it is severe call 911 IMMEDIATELY. If it is mild, please call our office. ?If you take any of these medications: Glipizide/Metformin, Avandament, Glucavance, please do not take 48 hours after completing test unless otherwise instructed. ? ?We will call to schedule your test 2-4 weeks out understanding that some insurance companies will need an authorization prior to the service being performed.  ? ?For non-scheduling related questions, please contact the cardiac imaging nurse navigator should you have any questions/concerns: ?Marchia Bond, Cardiac Imaging Nurse Navigator ?  Gordy Clement, Cardiac Imaging Nurse Navigator ?[facility] ?Direct Office Dial: [phone]  ? ?For scheduling needs, including cancellations and rescheduling, please call [place], [phone]. ? ? ? ?Follow-Up: ?At [facility], you and your health needs are our priority.  As part of our continuing  mission to provide you with exceptional heart care, we have created designated Provider Care Teams.  These Care Teams include your primary Cardiologist (physician) and Advanced Practice Providers (APPs -  Physician Assistants and Nurse Practitioners) who all work together to provide you with the care you need, when you need it. ? ?We recommend signing up for the patient portal called "MyChart".  Sign up information is provided on this After Visit Summary.  MyChart is used to connect with patients for Virtual Visits (Telemedicine).  Patients are able to view lab/test results, encounter notes, upcoming appointments, etc.  Non-urgent messages can be sent to your provider as well.   ?To learn more about what you can do with MyChart, go to [email or web address].   ? ?Your next appointment:   ?2 month(s) ? ?The format for your next appointment:   ?In Person ? ?Provider:   ?Jenne Campus, MD  ? ? ?Other Instructions ?None ? ?
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
## 2021-12-05 ENCOUNTER — Telehealth: Payer: Self-pay

## 2021-12-05 NOTE — Telephone Encounter (Signed)
Patient states ozempic was denied. She is questioning if there is something else she can try. She is out of the samples given at appointment.  ? ?Callback: [phone] ?Harrell Lark 12/05/21 3:35 PM ? ?

## 2021-12-06 ENCOUNTER — Ambulatory Visit: Payer: 59

## 2021-12-11 NOTE — Addendum Note (Signed)
Addended by: Darrel Reach on: 12/11/2021 08:51 AM ? ? Modules accepted: Orders ? ?

## 2021-12-12 ENCOUNTER — Other Ambulatory Visit: Payer: 59

## 2021-12-16 ENCOUNTER — Telehealth (HOSPITAL_COMMUNITY): Payer: Self-pay | Admitting: *Deleted

## 2021-12-16 ENCOUNTER — Other Ambulatory Visit: Payer: Self-pay | Admitting: Family Medicine

## 2021-12-16 DIAGNOSIS — M25562 Pain in left knee: Secondary | ICD-10-CM

## 2021-12-16 DIAGNOSIS — M25552 Pain in left hip: Secondary | ICD-10-CM

## 2021-12-16 DIAGNOSIS — K219 Gastro-esophageal reflux disease without esophagitis: Secondary | ICD-10-CM

## 2021-12-16 NOTE — Telephone Encounter (Signed)
Reaching out to patient to offer assistance regarding upcoming cardiac imaging study; pt verbalizes understanding of appt date/time, parking situation and where to check in, pre-test NPO status and medications ordered, and verified current allergies; name and call back number provided for further questions should they arise  Krayton Wortley RN Navigator Cardiac Imaging [facility] [phone] office [phone] cell  Patient to take 100mg metoprolol tartrate two hours prior to her cardiac CT scan. She is aware to arrive at 3:30pm. 

## 2021-12-16 NOTE — Telephone Encounter (Signed)
Refill sent to pharmacy.   

## 2021-12-17 ENCOUNTER — Ambulatory Visit (HOSPITAL_COMMUNITY)
Admission: RE | Admit: 2021-12-17 | Discharge: 2021-12-17 | Disposition: A | Discharge status: Home / Self Care | Payer: 59 | Source: Ambulatory Visit | Attending: Cardiology | Admitting: Cardiology

## 2021-12-17 DIAGNOSIS — R079 Chest pain, unspecified: Secondary | ICD-10-CM

## 2021-12-17 MED ORDER — NITROGLYCERIN 0.4 MG SL SUBL
SUBLINGUAL_TABLET | SUBLINGUAL | Status: AC
  Filled 2021-12-17: qty 2

## 2021-12-17 MED ORDER — METOPROLOL TARTRATE 5 MG/5ML IV SOLN
INTRAVENOUS | Status: AC
  Filled 2021-12-17: qty 10

## 2021-12-17 MED ORDER — METOPROLOL TARTRATE 5 MG/5ML IV SOLN: 5.0000 mg | INTRAVENOUS | Status: DC | PRN

## 2021-12-17 MED ORDER — IOHEXOL 350 MG/ML SOLN
100.0000 mL | Freq: Once | INTRAVENOUS | Status: AC | PRN
  Administered 2021-12-17: 100 mL via INTRAVENOUS

## 2021-12-17 MED ORDER — NITROGLYCERIN 0.4 MG SL SUBL
0.8000 mg | SUBLINGUAL_TABLET | Freq: Once | SUBLINGUAL | Status: AC
  Administered 2021-12-17: 0.8 mg via SUBLINGUAL

## 2021-12-20 NOTE — Telephone Encounter (Signed)
-----   Message from Park Liter, MD sent at 12/19/2021 12:42 PM EDT ----- ?Coronary CT angio showed no significant coronary artery stenosis ?

## 2021-12-20 NOTE — Telephone Encounter (Signed)
Patient notified of results.

## 2021-12-26 ENCOUNTER — Ambulatory Visit (HOSPITAL_COMMUNITY): Payer: 59 | Attending: Cardiology

## 2021-12-26 DIAGNOSIS — R079 Chest pain, unspecified: Secondary | ICD-10-CM

## 2021-12-26 DIAGNOSIS — R0789 Other chest pain: Secondary | ICD-10-CM | POA: Diagnosis present

## 2021-12-26 DIAGNOSIS — I453 Trifascicular block: Secondary | ICD-10-CM | POA: Insufficient documentation

## 2021-12-26 LAB — ECHOCARDIOGRAM COMPLETE
Area-P 1/2: 3.88 cm2
S' Lateral: 2.4 cm

## 2021-12-27 ENCOUNTER — Telehealth: Payer: Self-pay

## 2021-12-27 NOTE — Telephone Encounter (Signed)
-----   Message from Park Liter, MD sent at 12/27/2021  9:56 AM EDT ----- ?Echocardiogram showed preserved left ventricle ejection fraction, overall looks good ?

## 2021-12-27 NOTE — Telephone Encounter (Signed)
Patient notified of results.

## 2022-01-21 ENCOUNTER — Other Ambulatory Visit: Payer: Self-pay

## 2022-01-21 DIAGNOSIS — M25552 Pain in left hip: Secondary | ICD-10-CM

## 2022-01-21 DIAGNOSIS — M25562 Pain in left knee: Secondary | ICD-10-CM

## 2022-01-21 MED ORDER — MELOXICAM 7.5 MG PO TABS: 7.5000 mg | ORAL_TABLET | Freq: Every day | ORAL | 2 refills | Status: DC

## 2022-02-06 ENCOUNTER — Encounter: Payer: Self-pay | Admitting: Family Medicine

## 2022-02-06 ENCOUNTER — Ambulatory Visit: Payer: 59 | Admitting: Family Medicine

## 2022-02-06 VITALS — BP 110/78 | HR 76 | Temp 97.6°F | Ht 64.0 in | Wt 274.0 lb

## 2022-02-06 DIAGNOSIS — Z6841 Body Mass Index (BMI) 40.0 and over, adult: Secondary | ICD-10-CM

## 2022-02-06 DIAGNOSIS — R7303 Prediabetes: Secondary | ICD-10-CM | POA: Diagnosis not present

## 2022-02-06 DIAGNOSIS — F5081 Binge eating disorder: Secondary | ICD-10-CM | POA: Diagnosis not present

## 2022-02-06 DIAGNOSIS — R7989 Other specified abnormal findings of blood chemistry: Secondary | ICD-10-CM

## 2022-02-06 MED ORDER — LISDEXAMFETAMINE DIMESYLATE 30 MG PO CAPS: 30.0000 mg | ORAL_CAPSULE | ORAL | 0 refills | Status: DC
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
## 2022-02-06 NOTE — Patient Instructions (Signed)
Start Vyvanse 30 mg daily in a.m. this can be raised in 2 weeks if you do not feel like it is helping.  You will need to call the office.  Otherwise we will see you back in 1 month for follow-up.  Call dietitian to set up an appointment.

## 2022-02-07 ENCOUNTER — Other Ambulatory Visit: Payer: Self-pay

## 2022-02-07 DIAGNOSIS — R7989 Other specified abnormal findings of blood chemistry: Secondary | ICD-10-CM

## 2022-02-07 LAB — COMPREHENSIVE METABOLIC PANEL
ALT: 28 IU/L (ref 0–32)
AST: 19 IU/L (ref 0–40)
Albumin/Globulin Ratio: 1.6 (ref 1.2–2.2)
Albumin: 4.4 g/dL (ref 3.8–4.9)
Alkaline Phosphatase: 108 IU/L (ref 44–121)
BUN/Creatinine Ratio: 26 — ABNORMAL HIGH (ref 9–23)
BUN: 21 mg/dL (ref 6–24)
Bilirubin Total: 0.3 mg/dL (ref 0.0–1.2)
CO2: 23 mmol/L (ref 20–29)
Calcium: 9.6 mg/dL (ref 8.7–10.2)
Chloride: 103 mmol/L (ref 96–106)
Creatinine, Ser: 0.82 mg/dL (ref 0.57–1.00)
Globulin, Total: 2.7 g/dL (ref 1.5–4.5)
Glucose: 97 mg/dL (ref 70–99)
Potassium: 4.4 mmol/L (ref 3.5–5.2)
Sodium: 142 mmol/L (ref 134–144)
Total Protein: 7.1 g/dL (ref 6.0–8.5)
eGFR: 82 mL/min/{1.73_m2} (ref >59)

## 2022-02-07 LAB — CBC WITH DIFFERENTIAL/PLATELET
Basophils Absolute: 0.1 10*3/uL (ref 0.0–0.2)
Basos: 1 %
EOS (ABSOLUTE): 0.4 10*3/uL (ref 0.0–0.4)
Eos: 5 %
Hematocrit: 42.5 % (ref 34.0–46.6)
Hemoglobin: 14.3 g/dL (ref 11.1–15.9)
Immature Grans (Abs): 0 10*3/uL (ref 0.0–0.1)
Immature Granulocytes: 0 %
Lymphocytes Absolute: 2.3 10*3/uL (ref 0.7–3.1)
Lymphs: 29 %
MCH: 28 pg (ref 26.6–33.0)
MCHC: 33.6 g/dL (ref 31.5–35.7)
MCV: 83 fL (ref 79–97)
Monocytes Absolute: 0.6 10*3/uL (ref 0.1–0.9)
Monocytes: 8 %
Neutrophils Absolute: 4.5 10*3/uL (ref 1.4–7.0)
Neutrophils: 57 %
Platelets: 303 10*3/uL (ref 150–450)
RBC: 5.11 x10E6/uL (ref 3.77–5.28)
RDW: 14 % (ref 11.7–15.4)
WBC: 7.9 10*3/uL (ref 3.4–10.8)

## 2022-02-07 LAB — HEMOGLOBIN A1C
Est. average glucose Bld gHb Est-mCnc: 123 mg/dL
Hgb A1c MFr Bld: 5.9 % — ABNORMAL HIGH (ref 4.8–5.6)

## 2022-02-07 LAB — TSH: TSH: 4.64 u[IU]/mL — ABNORMAL HIGH (ref 0.450–4.500)

## 2022-02-07 MED ORDER — LEVOTHYROXINE SODIUM 25 MCG PO TABS: 25.0000 ug | ORAL_TABLET | Freq: Every day | ORAL | 0 refills | Status: DC

## 2022-02-09 DIAGNOSIS — R7303 Prediabetes: Secondary | ICD-10-CM | POA: Insufficient documentation

## 2022-02-09 DIAGNOSIS — R7989 Other specified abnormal findings of blood chemistry: Secondary | ICD-10-CM | POA: Insufficient documentation

## 2022-02-09 NOTE — Assessment & Plan Note (Signed)
Start Vyvanse 30 mg daily in a.m. this can be raised in 2 weeks if you do not feel like it is helping.  You will need to call the office.  Otherwise we will see you back in 1 month for follow-up.  Call dietitian to set up an appointment.

## 2022-02-09 NOTE — Assessment & Plan Note (Signed)
Check tsh 

## 2022-02-09 NOTE — Assessment & Plan Note (Signed)
Recommend continue to work on eating healthy diet and exercise.  

## 2022-02-10 ENCOUNTER — Ambulatory Visit: Payer: 59 | Admitting: Cardiology

## 2022-02-16 DIAGNOSIS — F5081 Binge eating disorder: Secondary | ICD-10-CM | POA: Insufficient documentation

## 2022-02-16 DIAGNOSIS — F50819 Binge eating disorder, unspecified: Secondary | ICD-10-CM | POA: Insufficient documentation

## 2022-02-16 NOTE — Assessment & Plan Note (Signed)
Start Vyvanse 30 mg daily in a.m. this can be raised in 2 weeks if you do not feel like it is helping.  You will need to call the office.  Otherwise we will see you back in 1 month for follow-up.  Call dietitian to set up an appointment.

## 2022-03-17 ENCOUNTER — Other Ambulatory Visit: Payer: Self-pay | Admitting: Family Medicine

## 2022-03-17 ENCOUNTER — Ambulatory Visit: Payer: 59 | Admitting: Family Medicine

## 2022-03-17 ENCOUNTER — Encounter: Payer: Self-pay | Admitting: Family Medicine

## 2022-03-17 VITALS — BP 112/72 | HR 75 | Resp 18 | Ht 64.0 in | Wt 271.2 lb

## 2022-03-17 DIAGNOSIS — K219 Gastro-esophageal reflux disease without esophagitis: Secondary | ICD-10-CM

## 2022-03-17 DIAGNOSIS — F5081 Binge eating disorder: Secondary | ICD-10-CM

## 2022-03-17 DIAGNOSIS — K7581 Nonalcoholic steatohepatitis (NASH): Secondary | ICD-10-CM | POA: Diagnosis not present

## 2022-03-17 DIAGNOSIS — R10816 Epigastric abdominal tenderness: Secondary | ICD-10-CM | POA: Insufficient documentation

## 2022-03-17 DIAGNOSIS — E039 Hypothyroidism, unspecified: Secondary | ICD-10-CM | POA: Diagnosis not present

## 2022-03-17 MED ORDER — FAMOTIDINE 20 MG PO TABS: 20.0000 mg | ORAL_TABLET | Freq: Every day | ORAL | 1 refills | Status: DC

## 2022-03-17 MED ORDER — LISDEXAMFETAMINE DIMESYLATE 40 MG PO CAPS: 40.0000 mg | ORAL_CAPSULE | ORAL | 0 refills | Status: DC

## 2022-03-17 NOTE — Patient Instructions (Addendum)
Meloxicam 7.5 mg once daily. Take with Food (supper.) Continue omeprazol 40 mg once in am  Start pepcid 20 mg one before supper.  Increase vyvanse to 40 mg once daily in am.  Follow up in one month for vyvanse.

## 2022-03-17 NOTE — Assessment & Plan Note (Signed)
Increase vyvanse to 40 mg once daily in am.  Follow up in one month for vyvanse.

## 2022-03-17 NOTE — Assessment & Plan Note (Signed)
Meloxicam 7.5 mg once daily for knee pain. Take with Food (supper.) Continue omeprazole 40 mg once in am  Start pepcid 20 mg one before supper.
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
## 2022-03-17 NOTE — Assessment & Plan Note (Signed)
Recommend weight loss.  

## 2022-03-17 NOTE — Assessment & Plan Note (Signed)
Previously not well controlled Started Synthroid at 25 mcg daily Recheck TSH and adjust Synthroid as indicated

## 2022-03-19 LAB — COMPREHENSIVE METABOLIC PANEL
ALT: 26 IU/L (ref 0–32)
AST: 13 IU/L (ref 0–40)
Albumin/Globulin Ratio: 1.7 (ref 1.2–2.2)
Albumin: 4.2 g/dL (ref 3.8–4.9)
Alkaline Phosphatase: 104 IU/L (ref 44–121)
BUN/Creatinine Ratio: 32 — ABNORMAL HIGH (ref 9–23)
BUN: 23 mg/dL (ref 6–24)
Bilirubin Total: 0.2 mg/dL (ref 0.0–1.2)
CO2: 22 mmol/L (ref 20–29)
Calcium: 9.1 mg/dL (ref 8.7–10.2)
Chloride: 103 mmol/L (ref 96–106)
Creatinine, Ser: 0.72 mg/dL (ref 0.57–1.00)
Globulin, Total: 2.5 g/dL (ref 1.5–4.5)
Glucose: 99 mg/dL (ref 70–99)
Potassium: 4.2 mmol/L (ref 3.5–5.2)
Sodium: 142 mmol/L (ref 134–144)
Total Protein: 6.7 g/dL (ref 6.0–8.5)
eGFR: 96 mL/min/{1.73_m2} (ref >59)

## 2022-03-19 LAB — CBC WITH DIFFERENTIAL/PLATELET
Basophils Absolute: 0.1 10*3/uL (ref 0.0–0.2)
Basos: 1 %
EOS (ABSOLUTE): 0.3 10*3/uL (ref 0.0–0.4)
Eos: 5 %
Hematocrit: 39.7 % (ref 34.0–46.6)
Hemoglobin: 13.1 g/dL (ref 11.1–15.9)
Immature Grans (Abs): 0 10*3/uL (ref 0.0–0.1)
Immature Granulocytes: 0 %
Lymphocytes Absolute: 2.3 10*3/uL (ref 0.7–3.1)
Lymphs: 31 %
MCH: 27.9 pg (ref 26.6–33.0)
MCHC: 33 g/dL (ref 31.5–35.7)
MCV: 85 fL (ref 79–97)
Monocytes Absolute: 0.6 10*3/uL (ref 0.1–0.9)
Monocytes: 8 %
Neutrophils Absolute: 4.3 10*3/uL (ref 1.4–7.0)
Neutrophils: 55 %
Platelets: 311 10*3/uL (ref 150–450)
RBC: 4.7 x10E6/uL (ref 3.77–5.28)
RDW: 13.9 % (ref 11.7–15.4)
WBC: 7.6 10*3/uL (ref 3.4–10.8)

## 2022-03-19 LAB — TSH: TSH: 4.33 u[IU]/mL (ref 0.450–4.500)

## 2022-03-19 LAB — HELICOBACTER PYLORI  ANTIBODY, IGM: H pylori, IgM Abs: <9 units (ref 0.0–8.9)

## 2022-03-19 NOTE — Progress Notes (Signed)
Blood count normal.  Liver function normal.  Kidney function normal.  Thyroid function normal.  H. Pylori negative. Continue pepcid and omeprazole. If no improvement, I would recommend refer to GI.

## 2022-04-02 ENCOUNTER — Encounter: Payer: Self-pay | Admitting: Cardiology

## 2022-04-02 ENCOUNTER — Ambulatory Visit: Payer: 59 | Admitting: Cardiology

## 2022-04-02 VITALS — BP 138/90 | HR 91 | Ht 64.0 in | Wt 269.2 lb

## 2022-04-02 DIAGNOSIS — Z6841 Body Mass Index (BMI) 40.0 and over, adult: Secondary | ICD-10-CM

## 2022-04-02 DIAGNOSIS — I453 Trifascicular block: Secondary | ICD-10-CM

## 2022-04-02 DIAGNOSIS — R0789 Other chest pain: Secondary | ICD-10-CM

## 2022-04-02 DIAGNOSIS — R0609 Other forms of dyspnea: Secondary | ICD-10-CM | POA: Diagnosis not present
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
## 2022-04-02 NOTE — Patient Instructions (Signed)

## 2022-04-16 ENCOUNTER — Ambulatory Visit: Payer: 59 | Admitting: Family Medicine

## 2022-04-16 VITALS — BP 128/86 | HR 84 | Temp 96.9°F | Resp 14 | Ht 64.0 in | Wt 267.0 lb

## 2022-04-16 DIAGNOSIS — K219 Gastro-esophageal reflux disease without esophagitis: Secondary | ICD-10-CM

## 2022-04-16 DIAGNOSIS — F5081 Binge eating disorder: Secondary | ICD-10-CM

## 2022-04-16 DIAGNOSIS — R7303 Prediabetes: Secondary | ICD-10-CM

## 2022-04-16 DIAGNOSIS — R10816 Epigastric abdominal tenderness: Secondary | ICD-10-CM | POA: Diagnosis not present

## 2022-04-16 DIAGNOSIS — F331 Major depressive disorder, recurrent, moderate: Secondary | ICD-10-CM

## 2022-04-16 DIAGNOSIS — F5104 Psychophysiologic insomnia: Secondary | ICD-10-CM

## 2022-04-16 MED ORDER — TRAZODONE HCL 50 MG PO TABS: 50.0000 mg | ORAL_TABLET | Freq: Every evening | ORAL | 2 refills | Status: DC | PRN

## 2022-04-16 MED ORDER — LISDEXAMFETAMINE DIMESYLATE 40 MG PO CAPS: 40.0000 mg | ORAL_CAPSULE | ORAL | 0 refills | Status: DC

## 2022-04-16 MED ORDER — BUPROPION HCL ER (XL) 150 MG PO TB24: 150.0000 mg | ORAL_TABLET | Freq: Every morning | ORAL | 0 refills | Status: DC
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
## 2022-04-16 NOTE — Patient Instructions (Signed)
Start wellbutrin xl 150 mg once in am.  Continue vyvanse 40 mg once in am.   Recommend [facility] and [employer] [facility].   Start on trazodone 50 mg 1/2 -1 at night for insomnia.

## 2022-04-18 ENCOUNTER — Encounter: Payer: Self-pay | Admitting: Family Medicine

## 2022-04-19 ENCOUNTER — Encounter: Payer: Self-pay | Admitting: Family Medicine

## 2022-04-19 DIAGNOSIS — F5104 Psychophysiologic insomnia: Secondary | ICD-10-CM | POA: Insufficient documentation

## 2022-04-19 DIAGNOSIS — F331 Major depressive disorder, recurrent, moderate: Secondary | ICD-10-CM | POA: Insufficient documentation

## 2022-04-19 DIAGNOSIS — F330 Major depressive disorder, recurrent, mild: Secondary | ICD-10-CM | POA: Insufficient documentation

## 2022-04-19 NOTE — Assessment & Plan Note (Signed)
Hemoglobin A1c 5.9%, 3 month avg of blood sugars, is in prediabetic range.  In order to prevent progression to diabetes, recommend low carb diet and regular exercise  

## 2022-04-19 NOTE — Assessment & Plan Note (Signed)
Start on trazodone 50 mg 1/2 -1 at night for insomnia.

## 2022-04-19 NOTE — Assessment & Plan Note (Signed)
Start wellbutrin xl 150 mg once in am.  Continue vyvanse 40 mg once in am.

## 2022-04-19 NOTE — Assessment & Plan Note (Signed)
Start wellbutrin xl 150 mg once in am.  Continue vyvanse 40 mg once in am.   Recommend [facility] and [employer].

## 2022-04-19 NOTE — Assessment & Plan Note (Signed)
The current medical regimen is effective;  continue present plan and medications.  Omeprazole 40 mg daily 

## 2022-04-19 NOTE — Assessment & Plan Note (Addendum)
The current medical regimen is effective;  continue present plan and medications. Omeprazole 40 mg once a day.

## 2022-05-14 ENCOUNTER — Other Ambulatory Visit: Payer: Self-pay | Admitting: Family Medicine

## 2022-05-14 DIAGNOSIS — R7989 Other specified abnormal findings of blood chemistry: Secondary | ICD-10-CM

## 2022-05-14 DIAGNOSIS — R10816 Epigastric abdominal tenderness: Secondary | ICD-10-CM

## 2022-06-13 ENCOUNTER — Other Ambulatory Visit: Payer: Self-pay | Admitting: Physician Assistant

## 2022-06-13 ENCOUNTER — Other Ambulatory Visit: Payer: Self-pay | Admitting: Family Medicine

## 2022-06-13 DIAGNOSIS — R10816 Epigastric abdominal tenderness: Secondary | ICD-10-CM

## 2022-06-13 DIAGNOSIS — K219 Gastro-esophageal reflux disease without esophagitis: Secondary | ICD-10-CM

## 2022-07-14 ENCOUNTER — Other Ambulatory Visit: Payer: Self-pay | Admitting: Family Medicine

## 2022-07-14 DIAGNOSIS — F331 Major depressive disorder, recurrent, moderate: Secondary | ICD-10-CM

## 2022-07-22 ENCOUNTER — Ambulatory Visit: Payer: 59 | Admitting: Family Medicine

## 2022-07-30 ENCOUNTER — Ambulatory Visit: Payer: 59 | Admitting: Family Medicine

## 2022-07-30 VITALS — BP 116/72 | HR 100 | Temp 96.6°F | Resp 18 | Ht 64.5 in | Wt 265.0 lb

## 2022-07-30 DIAGNOSIS — Z23 Encounter for immunization: Secondary | ICD-10-CM | POA: Diagnosis not present

## 2022-07-30 DIAGNOSIS — F5081 Binge eating disorder: Secondary | ICD-10-CM

## 2022-07-30 DIAGNOSIS — R7303 Prediabetes: Secondary | ICD-10-CM | POA: Diagnosis not present

## 2022-07-30 DIAGNOSIS — Z6841 Body Mass Index (BMI) 40.0 and over, adult: Secondary | ICD-10-CM

## 2022-07-30 DIAGNOSIS — M25522 Pain in left elbow: Secondary | ICD-10-CM

## 2022-07-30 DIAGNOSIS — R10816 Epigastric abdominal tenderness: Secondary | ICD-10-CM

## 2022-07-30 MED ORDER — LISDEXAMFETAMINE DIMESYLATE 50 MG PO CAPS: 50.0000 mg | ORAL_CAPSULE | Freq: Every morning | ORAL | 0 refills | Status: DC
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
## 2022-07-30 NOTE — Patient Instructions (Signed)
Use Ice compress along with pain medicine for left elbow pain

## 2022-07-31 LAB — COMPREHENSIVE METABOLIC PANEL
ALT: 16 IU/L (ref 0–32)
AST: 16 IU/L (ref 0–40)
Albumin/Globulin Ratio: 1.5 (ref 1.2–2.2)
Albumin: 4.4 g/dL (ref 3.8–4.9)
Alkaline Phosphatase: 116 IU/L (ref 44–121)
BUN/Creatinine Ratio: 20 (ref 9–23)
BUN: 17 mg/dL (ref 6–24)
Bilirubin Total: 0.3 mg/dL (ref 0.0–1.2)
CO2: 24 mmol/L (ref 20–29)
Calcium: 9.2 mg/dL (ref 8.7–10.2)
Chloride: 101 mmol/L (ref 96–106)
Creatinine, Ser: 0.85 mg/dL (ref 0.57–1.00)
Globulin, Total: 2.9 g/dL (ref 1.5–4.5)
Glucose: 110 mg/dL — ABNORMAL HIGH (ref 70–99)
Potassium: 4.9 mmol/L (ref 3.5–5.2)
Sodium: 140 mmol/L (ref 134–144)
Total Protein: 7.3 g/dL (ref 6.0–8.5)
eGFR: 79 mL/min/{1.73_m2} (ref >59)

## 2022-07-31 LAB — HEMOGLOBIN A1C
Est. average glucose Bld gHb Est-mCnc: 120 mg/dL
Hgb A1c MFr Bld: 5.8 % — ABNORMAL HIGH (ref 4.8–5.6)

## 2022-08-03 ENCOUNTER — Encounter: Payer: Self-pay | Admitting: Family Medicine

## 2022-08-03 DIAGNOSIS — M25522 Pain in left elbow: Secondary | ICD-10-CM | POA: Insufficient documentation

## 2022-08-03 DIAGNOSIS — Z23 Encounter for immunization: Secondary | ICD-10-CM | POA: Insufficient documentation

## 2022-08-03 NOTE — Assessment & Plan Note (Signed)
Increased the dose of Vyvanse from 40 to 50 mg once daily

## 2022-08-03 NOTE — Assessment & Plan Note (Signed)
Advised to use ice for a left elbow pain and call back if pain severity keeps increasing.

## 2022-08-03 NOTE — Assessment & Plan Note (Signed)
Labs drawn for CMP and HBA1C Recommend continue to work on eating healthy diet and exercise. Continue vyvanse

## 2022-08-03 NOTE — Assessment & Plan Note (Signed)
Check A1c. 

## 2022-08-04 ENCOUNTER — Encounter: Payer: Self-pay | Admitting: Family Medicine

## 2022-09-03 ENCOUNTER — Other Ambulatory Visit: Payer: Self-pay | Admitting: Physician Assistant

## 2022-09-03 DIAGNOSIS — R7989 Other specified abnormal findings of blood chemistry: Secondary | ICD-10-CM

## 2022-09-10 ENCOUNTER — Other Ambulatory Visit: Payer: Self-pay | Admitting: Family Medicine

## 2022-09-10 DIAGNOSIS — K219 Gastro-esophageal reflux disease without esophagitis: Secondary | ICD-10-CM

## 2022-09-10 DIAGNOSIS — F331 Major depressive disorder, recurrent, moderate: Secondary | ICD-10-CM

## 2022-09-11 ENCOUNTER — Other Ambulatory Visit: Payer: Self-pay

## 2022-09-11 DIAGNOSIS — F5081 Binge eating disorder: Secondary | ICD-10-CM

## 2022-09-11 MED ORDER — LISDEXAMFETAMINE DIMESYLATE 50 MG PO CAPS: 50.0000 mg | ORAL_CAPSULE | Freq: Every morning | ORAL | 0 refills | Status: DC

## 2022-09-22 ENCOUNTER — Encounter: Payer: Self-pay | Admitting: Family Medicine

## 2022-09-23 ENCOUNTER — Other Ambulatory Visit: Payer: Self-pay | Admitting: Family Medicine

## 2022-09-23 MED ORDER — METHYLPHENIDATE HCL ER (OSM) 18 MG PO TBCR: 18.0000 mg | EXTENDED_RELEASE_TABLET | Freq: Every day | ORAL | 0 refills | Status: DC

## 2022-09-30 ENCOUNTER — Encounter: Payer: Self-pay | Admitting: Family Medicine

## 2022-10-31 ENCOUNTER — Ambulatory Visit: Payer: 59 | Admitting: Family Medicine

## 2022-10-31 VITALS — BP 124/84 | HR 80 | Temp 96.4°F | Resp 18 | Ht 64.0 in | Wt 262.0 lb

## 2022-10-31 DIAGNOSIS — R7303 Prediabetes: Secondary | ICD-10-CM

## 2022-10-31 DIAGNOSIS — Z23 Encounter for immunization: Secondary | ICD-10-CM

## 2022-10-31 DIAGNOSIS — E039 Hypothyroidism, unspecified: Secondary | ICD-10-CM

## 2022-10-31 DIAGNOSIS — K7581 Nonalcoholic steatohepatitis (NASH): Secondary | ICD-10-CM

## 2022-10-31 DIAGNOSIS — F988 Other specified behavioral and emotional disorders with onset usually occurring in childhood and adolescence: Secondary | ICD-10-CM

## 2022-10-31 DIAGNOSIS — F5081 Binge eating disorder: Secondary | ICD-10-CM

## 2022-10-31 MED ORDER — ATOMOXETINE HCL 40 MG PO CAPS: 40.0000 mg | ORAL_CAPSULE | Freq: Every day | ORAL | 0 refills | Status: DC
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
## 2022-10-31 NOTE — Patient Instructions (Addendum)
Start strattera 40 mg daily. Discontinue vyvannse. Call back in 4 weeks for follow-up of medication.  Will adjust dose of strattera if needed. Tdap given today. Labs drawn.

## 2022-10-31 NOTE — Assessment & Plan Note (Signed)
Continue current medication.  TSH drawn.

## 2022-10-31 NOTE — Assessment & Plan Note (Signed)
Healthy diet and exercise as tolerated.

## 2022-11-01 DIAGNOSIS — F988 Other specified behavioral and emotional disorders with onset usually occurring in childhood and adolescence: Secondary | ICD-10-CM | POA: Insufficient documentation

## 2022-11-01 NOTE — Assessment & Plan Note (Signed)
Start strattera 40 mg daily. Discontinue vyvannse. Call back in 4 weeks for follow-up of medication.  Will adjust dose of strattera if needed.

## 2022-11-02 ENCOUNTER — Encounter: Payer: Self-pay | Admitting: Family Medicine

## 2022-11-02 DIAGNOSIS — Z23 Encounter for immunization: Secondary | ICD-10-CM | POA: Insufficient documentation

## 2022-11-02 NOTE — Assessment & Plan Note (Signed)
Recommend eat healthy diet and exercise.

## 2022-11-02 NOTE — Assessment & Plan Note (Signed)
Recommend weight loss.  

## 2022-11-05 LAB — COMPREHENSIVE METABOLIC PANEL
ALT: 17 IU/L (ref 0–32)
AST: 17 IU/L (ref 0–40)
Albumin/Globulin Ratio: 1.6 (ref 1.2–2.2)
Albumin: 4.6 g/dL (ref 3.8–4.9)
Alkaline Phosphatase: 111 IU/L (ref 44–121)
BUN/Creatinine Ratio: 18 (ref 9–23)
BUN: 16 mg/dL (ref 6–24)
Bilirubin Total: <0.2 mg/dL (ref 0.0–1.2)
CO2: 23 mmol/L (ref 20–29)
Calcium: 9.6 mg/dL (ref 8.7–10.2)
Chloride: 101 mmol/L (ref 96–106)
Creatinine, Ser: 0.91 mg/dL (ref 0.57–1.00)
Globulin, Total: 2.9 g/dL (ref 1.5–4.5)
Glucose: 98 mg/dL (ref 70–99)
Potassium: 4.3 mmol/L (ref 3.5–5.2)
Sodium: 138 mmol/L (ref 134–144)
Total Protein: 7.5 g/dL (ref 6.0–8.5)
eGFR: 73 mL/min/{1.73_m2} (ref >59)

## 2022-11-05 LAB — LIPID PANEL
Chol/HDL Ratio: 2.8 ratio (ref 0.0–4.4)
Cholesterol, Total: 155 mg/dL (ref 100–199)
HDL: 56 mg/dL (ref >39)
LDL Chol Calc (NIH): 82 mg/dL (ref 0–99)
Triglycerides: 92 mg/dL (ref 0–149)
VLDL Cholesterol Cal: 17 mg/dL (ref 5–40)

## 2022-11-05 LAB — CBC WITH DIFFERENTIAL/PLATELET
Basophils Absolute: 0.1 10*3/uL (ref 0.0–0.2)
Basos: 1 %
EOS (ABSOLUTE): 0.3 10*3/uL (ref 0.0–0.4)
Eos: 4 %
Hematocrit: 41.7 % (ref 34.0–46.6)
Hemoglobin: 13.8 g/dL (ref 11.1–15.9)
Immature Grans (Abs): 0 10*3/uL (ref 0.0–0.1)
Immature Granulocytes: 0 %
Lymphocytes Absolute: 2.2 10*3/uL (ref 0.7–3.1)
Lymphs: 27 %
MCH: 27.6 pg (ref 26.6–33.0)
MCHC: 33.1 g/dL (ref 31.5–35.7)
MCV: 83 fL (ref 79–97)
Monocytes Absolute: 0.5 10*3/uL (ref 0.1–0.9)
Monocytes: 6 %
Neutrophils Absolute: 5.2 10*3/uL (ref 1.4–7.0)
Neutrophils: 62 %
Platelets: 366 10*3/uL (ref 150–450)
RBC: 5 x10E6/uL (ref 3.77–5.28)
RDW: 13.8 % (ref 11.7–15.4)
WBC: 8.3 10*3/uL (ref 3.4–10.8)

## 2022-11-05 LAB — CARDIOVASCULAR RISK ASSESSMENT

## 2022-11-05 LAB — HEMOGLOBIN A1C
Est. average glucose Bld gHb Est-mCnc: 126 mg/dL
Hgb A1c MFr Bld: 6 % — ABNORMAL HIGH (ref 4.8–5.6)

## 2022-11-05 LAB — TSH: TSH: 3.74 u[IU]/mL (ref 0.450–4.500)

## 2022-12-03 ENCOUNTER — Other Ambulatory Visit: Payer: Self-pay | Admitting: Family Medicine

## 2022-12-03 DIAGNOSIS — F988 Other specified behavioral and emotional disorders with onset usually occurring in childhood and adolescence: Secondary | ICD-10-CM

## 2022-12-03 DIAGNOSIS — F5081 Binge eating disorder: Secondary | ICD-10-CM

## 2022-12-03 DIAGNOSIS — R7989 Other specified abnormal findings of blood chemistry: Secondary | ICD-10-CM

## 2022-12-05 ENCOUNTER — Encounter: Payer: Self-pay | Admitting: Physician Assistant

## 2022-12-05 ENCOUNTER — Telehealth: Payer: Self-pay

## 2022-12-05 ENCOUNTER — Ambulatory Visit: Payer: 59 | Admitting: Physician Assistant

## 2022-12-05 VITALS — BP 128/84 | HR 97 | Temp 97.1°F | Ht 64.0 in | Wt 260.0 lb

## 2022-12-05 DIAGNOSIS — R1084 Generalized abdominal pain: Secondary | ICD-10-CM

## 2022-12-05 DIAGNOSIS — R1031 Right lower quadrant pain: Secondary | ICD-10-CM

## 2022-12-05 DIAGNOSIS — R109 Unspecified abdominal pain: Secondary | ICD-10-CM

## 2022-12-05 LAB — POCT URINALYSIS DIP (CLINITEK)
Bilirubin, UA: NEGATIVE
Blood, UA: NEGATIVE
Glucose, UA: NEGATIVE mg/dL
Leukocytes, UA: NEGATIVE
Nitrite, UA: NEGATIVE
POC PROTEIN,UA: NEGATIVE
Spec Grav, UA: 1.02 (ref 1.010–1.025)
Urobilinogen, UA: 0.2 E.U./dL
pH, UA: 5 (ref 5.0–8.0)
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
## 2022-12-05 NOTE — Addendum Note (Signed)
Addended by: Marianne Sofia on: 12/05/2022 11:31 AM   Modules accepted: Orders

## 2022-12-05 NOTE — Telephone Encounter (Signed)
Patient would like her CT done at [facility] and not [place]. New order has to be put in and old order has to be canceled.

## 2022-12-06 LAB — COMPREHENSIVE METABOLIC PANEL
ALT: 21 IU/L (ref 0–32)
AST: 14 IU/L (ref 0–40)
Albumin/Globulin Ratio: 1.4 (ref 1.2–2.2)
Albumin: 4.4 g/dL (ref 3.8–4.9)
Alkaline Phosphatase: 121 IU/L (ref 44–121)
BUN/Creatinine Ratio: 14 (ref 9–23)
BUN: 13 mg/dL (ref 6–24)
Bilirubin Total: 0.3 mg/dL (ref 0.0–1.2)
CO2: 22 mmol/L (ref 20–29)
Calcium: 9.4 mg/dL (ref 8.7–10.2)
Chloride: 100 mmol/L (ref 96–106)
Creatinine, Ser: 0.92 mg/dL (ref 0.57–1.00)
Globulin, Total: 3.1 g/dL (ref 1.5–4.5)
Glucose: 102 mg/dL — ABNORMAL HIGH (ref 70–99)
Potassium: 4.5 mmol/L (ref 3.5–5.2)
Sodium: 139 mmol/L (ref 134–144)
Total Protein: 7.5 g/dL (ref 6.0–8.5)
eGFR: 72 mL/min/{1.73_m2} (ref >59)

## 2022-12-06 LAB — CBC WITH DIFFERENTIAL/PLATELET
Basophils Absolute: 0.1 10*3/uL (ref 0.0–0.2)
Basos: 1 %
EOS (ABSOLUTE): 0.4 10*3/uL (ref 0.0–0.4)
Eos: 4 %
Hematocrit: 43.1 % (ref 34.0–46.6)
Hemoglobin: 14.1 g/dL (ref 11.1–15.9)
Immature Grans (Abs): 0 10*3/uL (ref 0.0–0.1)
Immature Granulocytes: 0 %
Lymphocytes Absolute: 2.7 10*3/uL (ref 0.7–3.1)
Lymphs: 30 %
MCH: 27.1 pg (ref 26.6–33.0)
MCHC: 32.7 g/dL (ref 31.5–35.7)
MCV: 83 fL (ref 79–97)
Monocytes Absolute: 0.6 10*3/uL (ref 0.1–0.9)
Monocytes: 7 %
Neutrophils Absolute: 5.1 10*3/uL (ref 1.4–7.0)
Neutrophils: 58 %
Platelets: 374 10*3/uL (ref 150–450)
RBC: 5.21 x10E6/uL (ref 3.77–5.28)
RDW: 14.2 % (ref 11.7–15.4)
WBC: 8.8 10*3/uL (ref 3.4–10.8)

## 2022-12-15 ENCOUNTER — Other Ambulatory Visit: Payer: Self-pay | Admitting: Family Medicine

## 2022-12-15 DIAGNOSIS — F331 Major depressive disorder, recurrent, moderate: Secondary | ICD-10-CM

## 2022-12-15 DIAGNOSIS — K219 Gastro-esophageal reflux disease without esophagitis: Secondary | ICD-10-CM

## 2022-12-16 ENCOUNTER — Telehealth: Payer: Self-pay

## 2022-12-16 NOTE — Telephone Encounter (Signed)
I left a message on the number(s) listed in the patients chart requesting the patient to call back regarding the upcomming appointment for 02/11/2023. The provider is out of the office that day. The appointment has been canceled and rescheduled to July 3 at 10:20 AM. Mailed a letter and sent a mychart message. Waiting for the patient to return the call.

## 2022-12-17 ENCOUNTER — Ambulatory Visit
Admission: RE | Admit: 2022-12-17 | Discharge: 2022-12-17 | Disposition: A | Discharge status: Home / Self Care | Payer: 59 | Source: Ambulatory Visit | Attending: Physician Assistant | Admitting: Physician Assistant

## 2022-12-17 DIAGNOSIS — R1031 Right lower quadrant pain: Secondary | ICD-10-CM

## 2022-12-17 MED ORDER — IOPAMIDOL (ISOVUE-300) INJECTION 61%
100.0000 mL | Freq: Once | INTRAVENOUS | Status: AC | PRN
  Administered 2022-12-17: 100 mL via INTRAVENOUS

## 2022-12-22 ENCOUNTER — Other Ambulatory Visit: Payer: Self-pay

## 2022-12-22 ENCOUNTER — Telehealth: Payer: Self-pay | Admitting: Gastroenterology

## 2022-12-22 DIAGNOSIS — R10816 Epigastric abdominal tenderness: Secondary | ICD-10-CM

## 2022-12-22 NOTE — Telephone Encounter (Signed)
Per patient request it is OK with me to transfer to Dr. Myrtie Neither if he agrees to accept.

## 2022-12-22 NOTE — Telephone Encounter (Signed)
HI Dr. Russella Dar,  Patient called from a referral we received she has GI history with you, however the patient is specifically asking to continue her care with Dr. Myrtie Neither if possible her mother is his patient and she would like to switch.  Please advise.    Thanks

## 2022-12-23 NOTE — Telephone Encounter (Signed)
Hi Dr. Myrtie Neither,   Would you accept the transfer request below?   Thanks

## 2022-12-23 NOTE — Telephone Encounter (Signed)
Sure thing  - HD 

## 2022-12-24 ENCOUNTER — Encounter: Payer: Self-pay | Admitting: Gastroenterology

## 2022-12-31 ENCOUNTER — Ambulatory Visit (INDEPENDENT_AMBULATORY_CARE_PROVIDER_SITE_OTHER): Payer: 59 | Admitting: Gastroenterology

## 2022-12-31 ENCOUNTER — Encounter: Payer: Self-pay | Admitting: Gastroenterology

## 2022-12-31 VITALS — BP 110/80 | HR 92 | Ht 63.0 in | Wt 260.5 lb

## 2022-12-31 DIAGNOSIS — R109 Unspecified abdominal pain: Secondary | ICD-10-CM

## 2022-12-31 NOTE — Patient Instructions (Signed)
_______________________________________________________  If your blood pressure at your visit was 140/90 or greater, please contact your primary care physician to follow up on this.  _______________________________________________________  If you are age 60 or older, your body mass index should be between 23-30. Your Body mass index is 46.15 kg/m. If this is out of the aforementioned range listed, please consider follow up with your Primary Care Provider.  If you are age 68 or younger, your body mass index should be between 19-25. Your Body mass index is 46.15 kg/m. If this is out of the aformentioned range listed, please consider follow up with your Primary Care Provider.   ________________________________________________________  The [facility] providers would like to encourage you to use [facility] to communicate with providers for non-urgent requests or questions.  Due to long hold times on the telephone, sending your provider a message by [facility] may be a faster and more efficient way to get a response.  Please allow 48 business hours for a response.  Please remember that this is for non-urgent requests.  _______________________________________________________  It was a pleasure to see you today!  Thank you for trusting me with your gastrointestinal care!
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
## 2023-01-02 ENCOUNTER — Other Ambulatory Visit: Payer: Self-pay

## 2023-01-02 DIAGNOSIS — F988 Other specified behavioral and emotional disorders with onset usually occurring in childhood and adolescence: Secondary | ICD-10-CM

## 2023-01-02 MED ORDER — ATOMOXETINE HCL 40 MG PO CAPS
40.0000 mg | ORAL_CAPSULE | Freq: Every day | ORAL | 0 refills | Status: DC
Start: 2023-01-02 — End: 2023-02-03

## 2023-02-03 ENCOUNTER — Other Ambulatory Visit: Payer: Self-pay | Admitting: Family Medicine

## 2023-02-03 DIAGNOSIS — F988 Other specified behavioral and emotional disorders with onset usually occurring in childhood and adolescence: Secondary | ICD-10-CM

## 2023-02-09 ENCOUNTER — Telehealth: Payer: Self-pay | Admitting: Family Medicine

## 2023-02-09 ENCOUNTER — Telehealth: Payer: Self-pay

## 2023-02-09 NOTE — Telephone Encounter (Signed)
The patient called the same day and spoke with [facility] requesting an appointment for a lump under her arm. I called the patient back. Patient stated that she has a place under her arm that occurred last Tuesday. She described it as a red bump that is getting bigger. She stated that it is hard and painful. Also, she stated that she had something similar to this that occurred a month ago but it got better.  Patient notified that we have no opens today or ever this week. Patient notified that Dr. Sedalia Muta is out of the office.   Patient notified that it would be best for her to go Urgent Care to be seen VS waiting. Patient stated ok.

## 2023-02-09 NOTE — Telephone Encounter (Signed)
error 

## 2023-02-11 ENCOUNTER — Ambulatory Visit: Payer: 59 | Admitting: Family Medicine

## 2023-03-01 IMAGING — US US EXTREM  UP VENOUS*L*
1 series · 13 of 24 positions shown · non-contrast
Comparison: None.

CLINICAL DATA: Left upper extremity edema.



[Series 1: us extrem up venous*left* · 0.07mm/px · 13 of 31 slices shown]
[im 1/31]
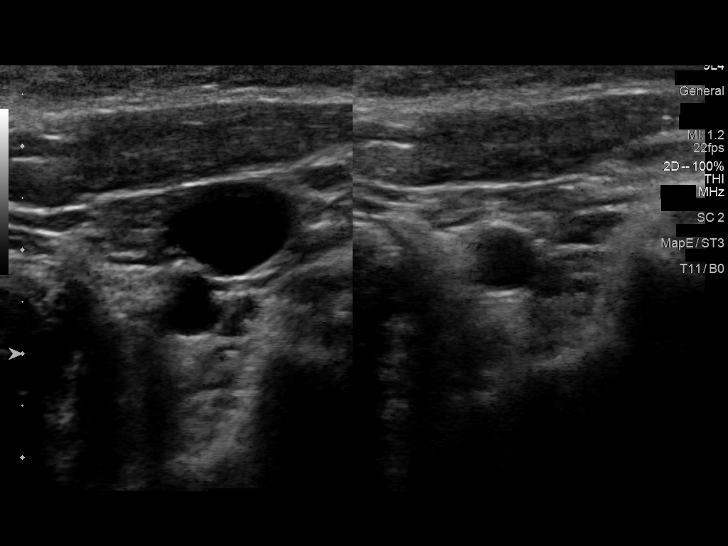
[im 3/31]
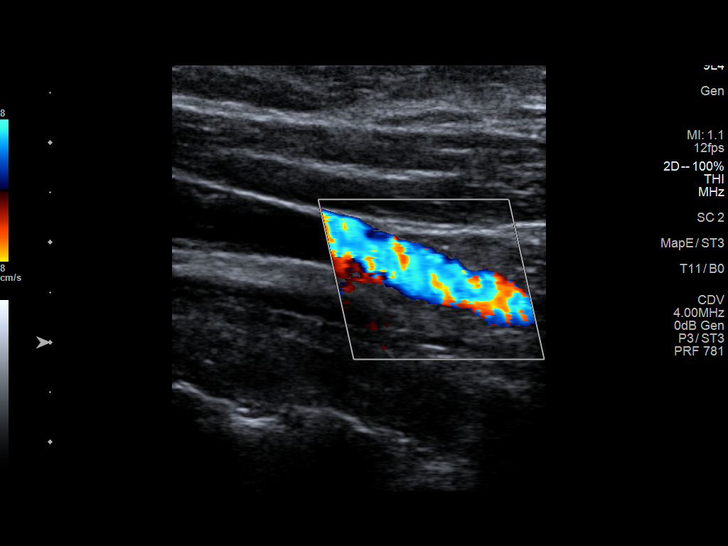
[im 6/31]
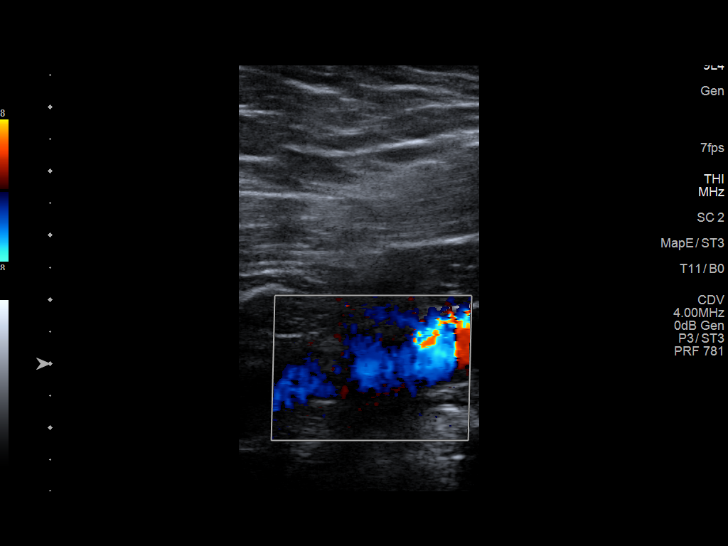
[im 8/31]
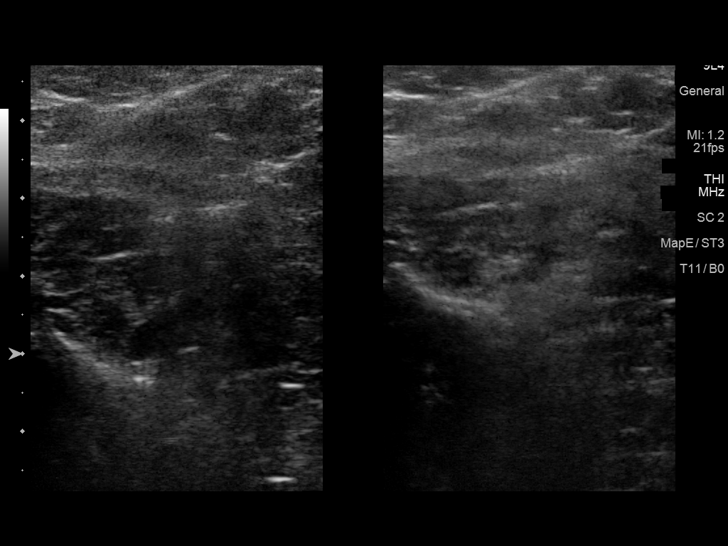
[im 11/31]
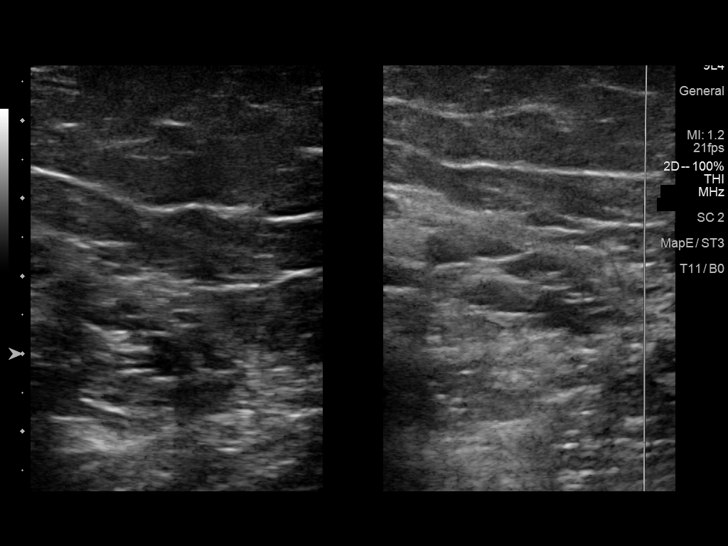
[im 14/31]
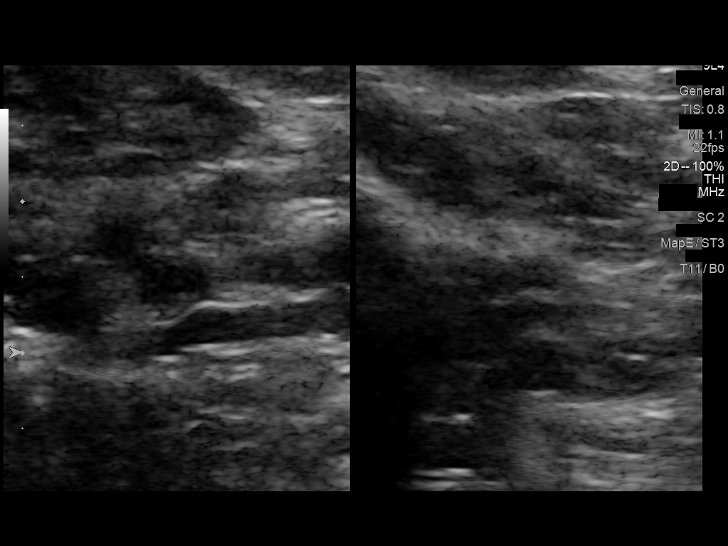
[im 16/31]
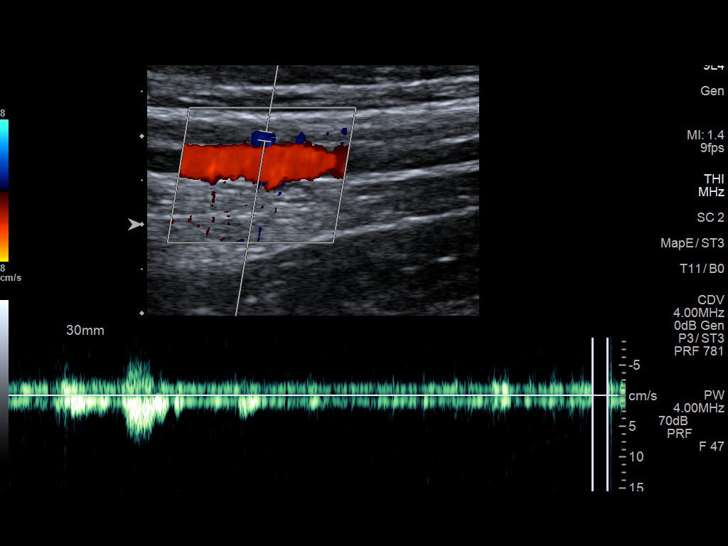
[im 17/31]
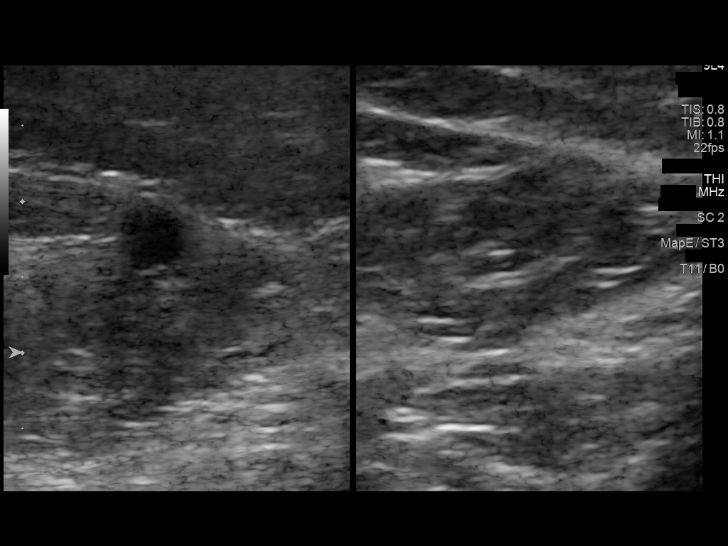
[im 20/31]
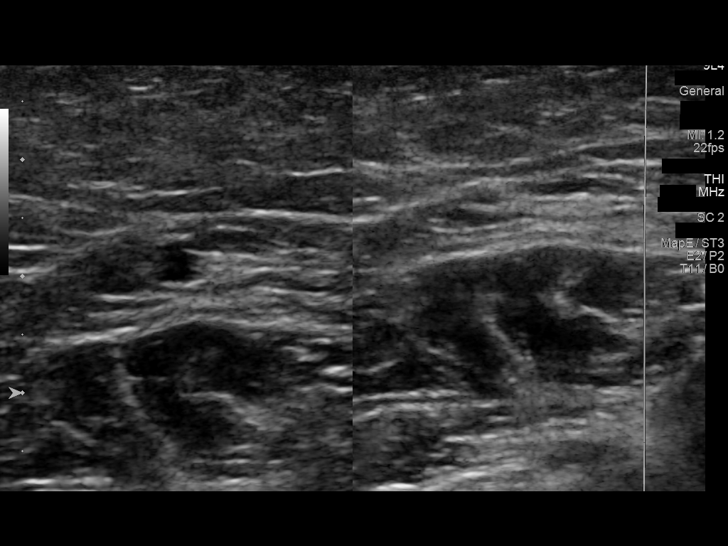
[im 23/31]
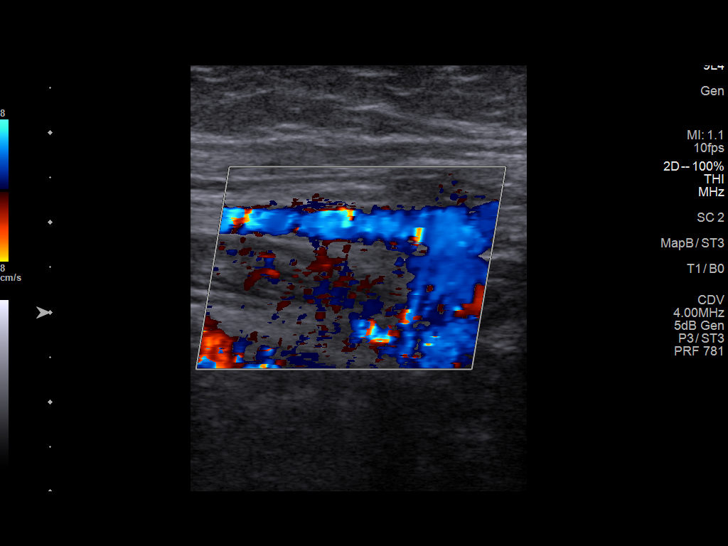
[im 25/31]
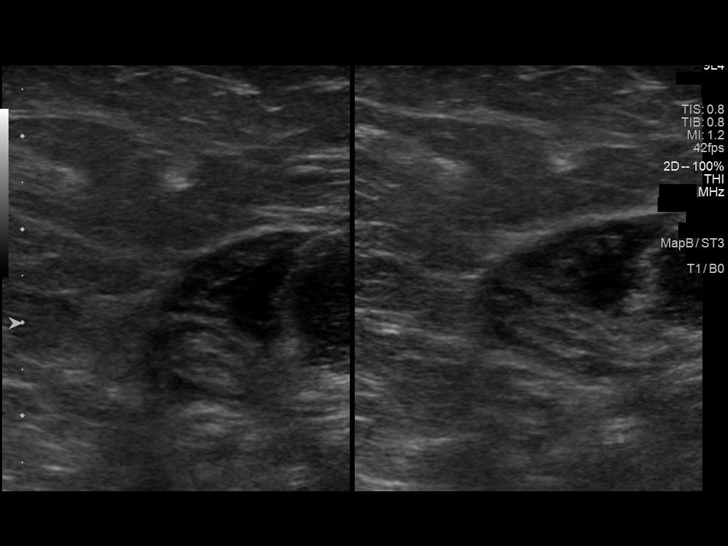
[im 28/31]
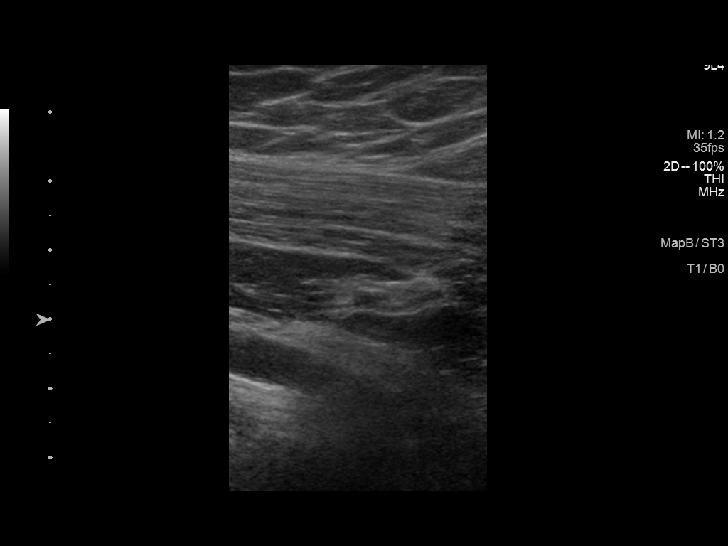
[im 31/31]
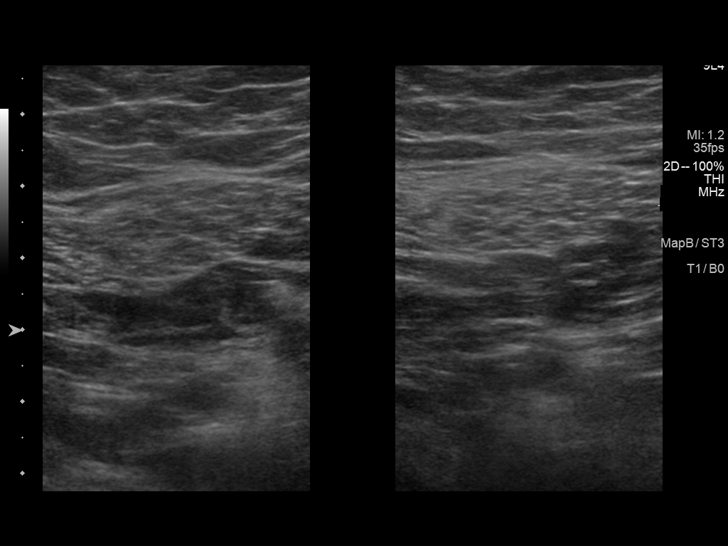

[13 of 24 positions shown; findings below may reference images not displayed]

FINDINGS: Contralateral Subclavian Vein: Respiratory phasicity is normal and
symmetric with the symptomatic side. No evidence of thrombus. Normal
compressibility.

Internal Jugular Vein: No evidence of thrombus. Normal
compressibility, respiratory phasicity and response to augmentation.

Subclavian Vein: No evidence of thrombus. Normal compressibility,
respiratory phasicity and response to augmentation.

Axillary Vein: No evidence of thrombus. Normal compressibility,
respiratory phasicity and response to augmentation.

Cephalic Vein: No evidence of thrombus. Normal compressibility,
respiratory phasicity and response to augmentation.

Basilic Vein: No evidence of thrombus. Normal compressibility,
respiratory phasicity and response to augmentation.

Brachial Veins: No evidence of thrombus. Normal compressibility,
respiratory phasicity and response to augmentation.

Radial Veins: No evidence of thrombus. Normal compressibility,
respiratory phasicity and response to augmentation.

Ulnar Veins: No evidence of thrombus. Normal compressibility,
respiratory phasicity and response to augmentation.

Venous Reflux:  None visualized.

Other Findings:  None visualized.
IMPRESSION: No evidence of DVT within the left upper extremity.
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
## 2023-03-03 NOTE — Assessment & Plan Note (Addendum)
Continue current medication.  Synthroid 25 mcg once daily in AM.

## 2023-03-03 NOTE — Assessment & Plan Note (Addendum)
Not at goal.  Increase straterra to 60 mg daily

## 2023-03-03 NOTE — Assessment & Plan Note (Signed)
Healthy diet and exercise as tolerated.   

## 2023-03-03 NOTE — Assessment & Plan Note (Signed)
The current medical regimen is effective;  continue present plan and medications.  Omeprazole 40 mg daily 

## 2023-03-04 ENCOUNTER — Ambulatory Visit: Payer: 59 | Admitting: Family Medicine

## 2023-03-04 ENCOUNTER — Encounter: Payer: Self-pay | Admitting: Family Medicine

## 2023-03-04 VITALS — BP 110/72 | HR 84 | Temp 96.7°F | Resp 18 | Ht 63.0 in | Wt 262.0 lb

## 2023-03-04 DIAGNOSIS — F988 Other specified behavioral and emotional disorders with onset usually occurring in childhood and adolescence: Secondary | ICD-10-CM

## 2023-03-04 DIAGNOSIS — Z23 Encounter for immunization: Secondary | ICD-10-CM | POA: Diagnosis not present

## 2023-03-04 DIAGNOSIS — K219 Gastro-esophageal reflux disease without esophagitis: Secondary | ICD-10-CM

## 2023-03-04 DIAGNOSIS — M542 Cervicalgia: Secondary | ICD-10-CM

## 2023-03-04 DIAGNOSIS — R7303 Prediabetes: Secondary | ICD-10-CM

## 2023-03-04 DIAGNOSIS — M25512 Pain in left shoulder: Secondary | ICD-10-CM

## 2023-03-04 DIAGNOSIS — Z6841 Body Mass Index (BMI) 40.0 and over, adult: Secondary | ICD-10-CM

## 2023-03-04 DIAGNOSIS — R109 Unspecified abdominal pain: Secondary | ICD-10-CM

## 2023-03-04 DIAGNOSIS — E039 Hypothyroidism, unspecified: Secondary | ICD-10-CM | POA: Diagnosis not present

## 2023-03-04 DIAGNOSIS — F331 Major depressive disorder, recurrent, moderate: Secondary | ICD-10-CM

## 2023-03-04 LAB — CBC WITH DIFFERENTIAL/PLATELET
Basophils Absolute: 0.1 10*3/uL (ref 0.0–0.2)
Basos: 1 %
EOS (ABSOLUTE): 0.4 10*3/uL (ref 0.0–0.4)
Eos: 4 %
Hematocrit: 40.8 % (ref 34.0–46.6)
Hemoglobin: 13.7 g/dL (ref 11.1–15.9)
Immature Grans (Abs): 0 10*3/uL (ref 0.0–0.1)
Immature Granulocytes: 0 %
Lymphocytes Absolute: 2.2 10*3/uL (ref 0.7–3.1)
Lymphs: 24 %
MCH: 28.5 pg (ref 26.6–33.0)
MCHC: 33.6 g/dL (ref 31.5–35.7)
MCV: 85 fL (ref 79–97)
Monocytes Absolute: 0.7 10*3/uL (ref 0.1–0.9)
Monocytes: 7 %
Neutrophils Absolute: 5.6 10*3/uL (ref 1.4–7.0)
Neutrophils: 64 %
Platelets: 340 10*3/uL (ref 150–450)
RBC: 4.8 x10E6/uL (ref 3.77–5.28)
RDW: 14 % (ref 11.7–15.4)
WBC: 8.9 10*3/uL (ref 3.4–10.8)

## 2023-03-04 LAB — COMPREHENSIVE METABOLIC PANEL
ALT: 21 IU/L (ref 0–32)
AST: 19 IU/L (ref 0–40)
Albumin: 4.3 g/dL (ref 3.8–4.9)
Alkaline Phosphatase: 109 IU/L (ref 44–121)
BUN/Creatinine Ratio: 15 (ref 12–28)
BUN: 14 mg/dL (ref 8–27)
Bilirubin Total: 0.3 mg/dL (ref 0.0–1.2)
CO2: 22 mmol/L (ref 20–29)
Calcium: 9.3 mg/dL (ref 8.7–10.3)
Chloride: 100 mmol/L (ref 96–106)
Creatinine, Ser: 0.93 mg/dL (ref 0.57–1.00)
Globulin, Total: 3.2 g/dL (ref 1.5–4.5)
Glucose: 101 mg/dL — ABNORMAL HIGH (ref 70–99)
Potassium: 4.7 mmol/L (ref 3.5–5.2)
Sodium: 138 mmol/L (ref 134–144)
Total Protein: 7.5 g/dL (ref 6.0–8.5)
eGFR: 70 mL/min/{1.73_m2} (ref >59)

## 2023-03-04 LAB — HEMOGLOBIN A1C
Est. average glucose Bld gHb Est-mCnc: 126 mg/dL
Hgb A1c MFr Bld: 6 % — ABNORMAL HIGH (ref 4.8–5.6)

## 2023-03-04 MED ORDER — BUPROPION HCL ER (XL) 300 MG PO TB24
300.00 mg | ORAL_TABLET | Freq: Every day | ORAL | 2 refills | Status: AC
Start: 2023-03-04 — End: ?

## 2023-03-04 MED ORDER — ATOMOXETINE HCL 60 MG PO CAPS
60.0000 mg | ORAL_CAPSULE | Freq: Every day | ORAL | 2 refills | Status: AC
Start: 2023-03-04 — End: ?

## 2023-03-04 NOTE — Patient Instructions (Addendum)
Increase wellbutrin XL to 300 mg daily. Increase straterra to 60 mg daily  Schedule appointment with GYN for mammogram and check-up.   Shingrix vaccine #1 given.  Refer to physical therapy for neck and left shoulder pain, as well as right side pain.

## 2023-03-06 DIAGNOSIS — M25512 Pain in left shoulder: Secondary | ICD-10-CM | POA: Insufficient documentation

## 2023-03-06 DIAGNOSIS — M542 Cervicalgia: Secondary | ICD-10-CM | POA: Insufficient documentation

## 2023-03-06 DIAGNOSIS — R109 Unspecified abdominal pain: Secondary | ICD-10-CM | POA: Insufficient documentation

## 2023-03-06 NOTE — Assessment & Plan Note (Signed)
Refer to physical therapy for neck and left shoulder pain, as well as right side pain.  Requesting dry needling. 

## 2023-03-06 NOTE — Assessment & Plan Note (Signed)
Not at goal.  Increase wellbutrin XL to 300 mg daily.

## 2023-03-06 NOTE — Assessment & Plan Note (Signed)
Refer to physical therapy for neck and left shoulder pain, as well as right side pain.  Requesting dry needling.

## 2023-03-08 NOTE — Assessment & Plan Note (Signed)
Continue to work on diet and exercise

## 2023-03-09 ENCOUNTER — Encounter: Payer: Self-pay | Admitting: Family Medicine

## 2023-03-11 ENCOUNTER — Ambulatory Visit: Payer: 59 | Admitting: Gastroenterology

## 2023-03-17 ENCOUNTER — Other Ambulatory Visit: Payer: Self-pay | Admitting: Family Medicine

## 2023-03-17 DIAGNOSIS — R7989 Other specified abnormal findings of blood chemistry: Secondary | ICD-10-CM

## 2023-05-08 ENCOUNTER — Ambulatory Visit: Payer: 59

## 2023-05-14 ENCOUNTER — Ambulatory Visit: Payer: 59

## 2023-05-15 ENCOUNTER — Ambulatory Visit (INDEPENDENT_AMBULATORY_CARE_PROVIDER_SITE_OTHER): Payer: 59

## 2023-05-15 DIAGNOSIS — Z23 Encounter for immunization: Secondary | ICD-10-CM | POA: Diagnosis not present

## 2023-06-02 ENCOUNTER — Other Ambulatory Visit: Payer: Self-pay | Admitting: Family Medicine

## 2023-06-02 DIAGNOSIS — F331 Major depressive disorder, recurrent, moderate: Secondary | ICD-10-CM

## 2023-06-02 DIAGNOSIS — F988 Other specified behavioral and emotional disorders with onset usually occurring in childhood and adolescence: Secondary | ICD-10-CM

## 2023-06-17 ENCOUNTER — Other Ambulatory Visit: Payer: Self-pay | Admitting: Family Medicine

## 2023-06-17 DIAGNOSIS — R7989 Other specified abnormal findings of blood chemistry: Secondary | ICD-10-CM

## 2023-06-17 DIAGNOSIS — K219 Gastro-esophageal reflux disease without esophagitis: Secondary | ICD-10-CM

## 2023-07-02 ENCOUNTER — Ambulatory Visit: Payer: 59 | Admitting: Physician Assistant

## 2023-07-02 ENCOUNTER — Encounter: Payer: Self-pay | Admitting: Physician Assistant

## 2023-07-02 DIAGNOSIS — M1712 Unilateral primary osteoarthritis, left knee: Secondary | ICD-10-CM

## 2023-07-02 MED ORDER — METHYLPREDNISOLONE ACETATE 40 MG/ML IJ SUSP
40.0000 mg | INTRAMUSCULAR | Status: AC | PRN
Start: 2023-07-02 — End: 2023-07-02
  Administered 2023-07-02: 40 mg via INTRA_ARTICULAR
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
## 2023-07-05 NOTE — Assessment & Plan Note (Signed)
Discontinue straterra. Start vyvanse 30 mg daily. This also helped with binging. Marland Kitchen
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
## 2023-07-05 NOTE — Assessment & Plan Note (Signed)
The current medical regimen is effective;  continue present plan and medications.  Omeprazole 40 mg daily 

## 2023-07-05 NOTE — Assessment & Plan Note (Signed)
Continue on wellbutrin XL to 300 mg daily.

## 2023-07-05 NOTE — Assessment & Plan Note (Signed)
Healthy diet and exercise as tolerated.   

## 2023-07-05 NOTE — Assessment & Plan Note (Signed)
Continue current medication.  Synthroid 25 mcg once daily in AM.

## 2023-07-06 ENCOUNTER — Other Ambulatory Visit: Payer: Self-pay

## 2023-07-06 ENCOUNTER — Ambulatory Visit (INDEPENDENT_AMBULATORY_CARE_PROVIDER_SITE_OTHER): Payer: 59 | Admitting: Family Medicine

## 2023-07-06 ENCOUNTER — Encounter: Payer: Self-pay | Admitting: Family Medicine

## 2023-07-06 VITALS — BP 124/82 | HR 92 | Temp 97.3°F | Ht 63.0 in | Wt 255.0 lb

## 2023-07-06 DIAGNOSIS — R7303 Prediabetes: Secondary | ICD-10-CM | POA: Diagnosis not present

## 2023-07-06 DIAGNOSIS — E66813 Obesity, class 3: Secondary | ICD-10-CM

## 2023-07-06 DIAGNOSIS — Z23 Encounter for immunization: Secondary | ICD-10-CM | POA: Diagnosis not present

## 2023-07-06 DIAGNOSIS — F988 Other specified behavioral and emotional disorders with onset usually occurring in childhood and adolescence: Secondary | ICD-10-CM | POA: Diagnosis not present

## 2023-07-06 DIAGNOSIS — E039 Hypothyroidism, unspecified: Secondary | ICD-10-CM

## 2023-07-06 DIAGNOSIS — F50811 Binge eating disorder, moderate: Secondary | ICD-10-CM

## 2023-07-06 DIAGNOSIS — K219 Gastro-esophageal reflux disease without esophagitis: Secondary | ICD-10-CM | POA: Diagnosis not present

## 2023-07-06 DIAGNOSIS — Z6841 Body Mass Index (BMI) 40.0 and over, adult: Secondary | ICD-10-CM

## 2023-07-06 DIAGNOSIS — F330 Major depressive disorder, recurrent, mild: Secondary | ICD-10-CM

## 2023-07-06 DIAGNOSIS — F331 Major depressive disorder, recurrent, moderate: Secondary | ICD-10-CM

## 2023-07-06 MED ORDER — LISDEXAMFETAMINE DIMESYLATE 30 MG PO CAPS: 30.0000 mg | ORAL_CAPSULE | Freq: Every day | ORAL | 0 refills | Status: DC

## 2023-07-06 NOTE — Assessment & Plan Note (Addendum)
Start back on vyvanse 30 mg daily. Only was stopped previously because she could not find it at the pharmacy, Patient to call back monthly to increase dose. Follow up in 3 months for chronic, but will schedule with Dr. Faylene Kurtz to address obesity.

## 2023-07-06 NOTE — Assessment & Plan Note (Addendum)
Recommend continue to work on eating healthy diet and exercise. Follow up in 3 months for chronic, but will schedule with Dr. Faylene Kurtz to address obesity. Patient did well on ozempic when insurance was allowing [place] to give for prediabetes, but her insurance will not pay for wegovy.  Patient is changing insurance after the first of the year and would consider trying again to get the wegovy.  Also restarted and currently titrating up vyvanse.

## 2023-07-06 NOTE — Patient Instructions (Signed)
Stop strattera Start vyvanse 30 mg daily.

## 2023-07-07 LAB — HEMOGLOBIN A1C
Est. average glucose Bld gHb Est-mCnc: 128 mg/dL
Hgb A1c MFr Bld: 6.1 % — ABNORMAL HIGH (ref 4.8–5.6)

## 2023-07-07 LAB — LIPID PANEL
Chol/HDL Ratio: 2.8 ratio (ref 0.0–4.4)
Cholesterol, Total: 157 mg/dL (ref 100–199)
HDL: 57 mg/dL (ref >39)
LDL Chol Calc (NIH): 81 mg/dL (ref 0–99)
Triglycerides: 107 mg/dL (ref 0–149)
VLDL Cholesterol Cal: 19 mg/dL (ref 5–40)

## 2023-07-30 IMAGING — CT CT HEART MORP W/ CTA COR W/ SCORE W/ CA W/CM &/OR W/O CM
4 of 7 series · 8 of 20 positions shown, 9 images · non-contrast
Comparison: 11/21/2013
COMPARISON: 11/21/2013

Addendum:
EXAM:
OVER-READ INTERPRETATION  CT CHEST

The following report is an over-read performed by radiologist Dr.
Tashew Garrykishiyev [REDACTED] on 12/17/2021. This over-read
does not include interpretation of cardiac or coronary anatomy or
pathology. The coronary CTA interpretation by the cardiologist is
attached.
CLINICAL DATA: CP
Cardiac/Coronary  CTA
TECHNIQUE: The patient was scanned on a [employer] Force scanner.

[Series 8: ts diast sharp · axial · 0.39mm/px · z∈[+1257,+1302]mm · 2 of 336 slices shown]
[im 112/336  lung]
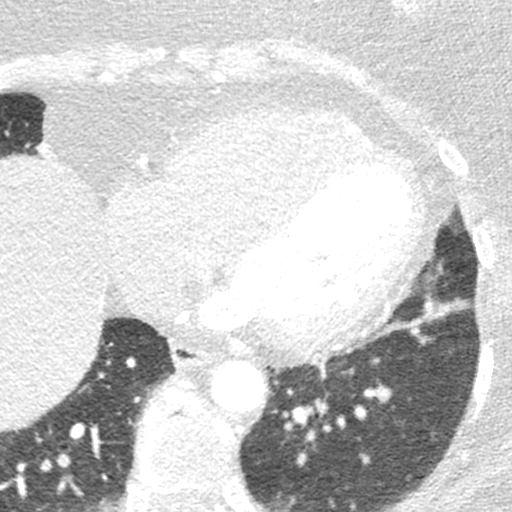
[im 224/336  lung]
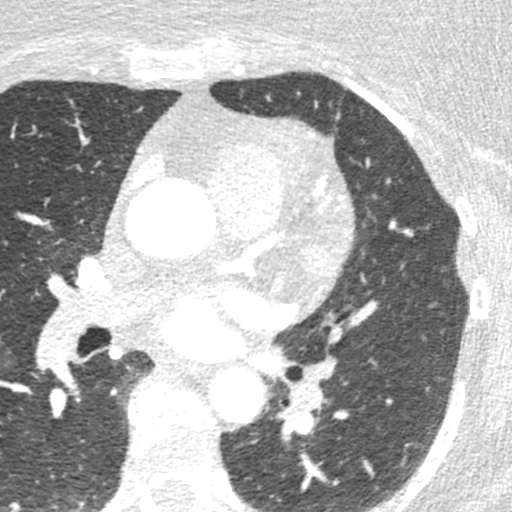

[Series 9: best syst · axial · 0.39mm/px · z∈[+1257,+1302]mm · 2 of 336 slices shown, 3 images]
[im 112/336  vessel]
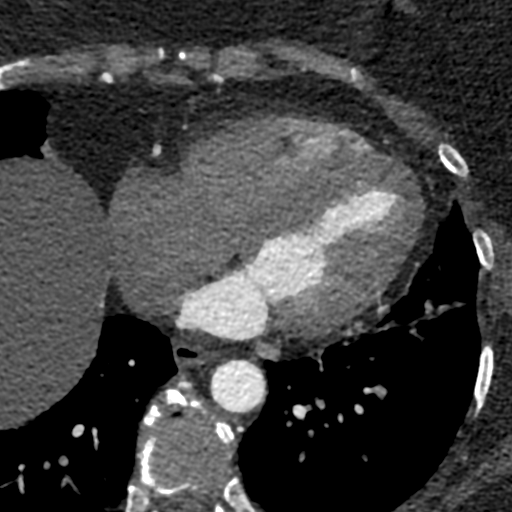
[im 112/336  lung]
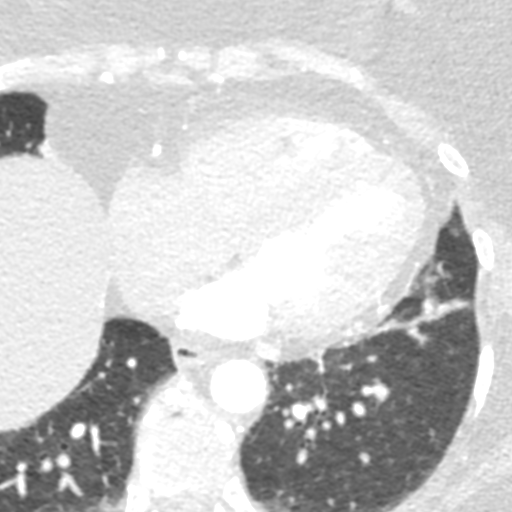
[im 224/336  vessel]
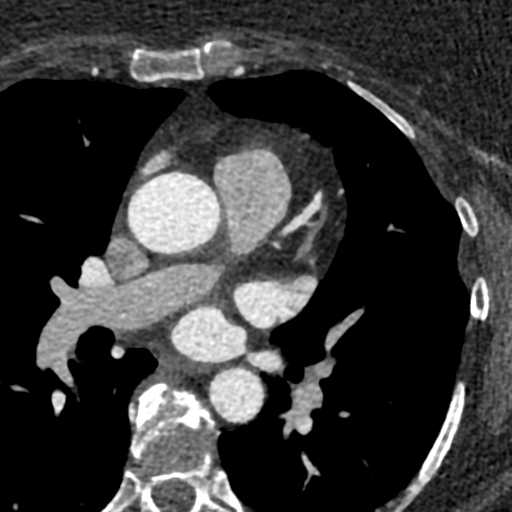

[Series 10: best diast · axial · 0.39mm/px · z∈[+1257,+1302]mm · 2 of 336 slices shown]
[im 112/336  vessel]
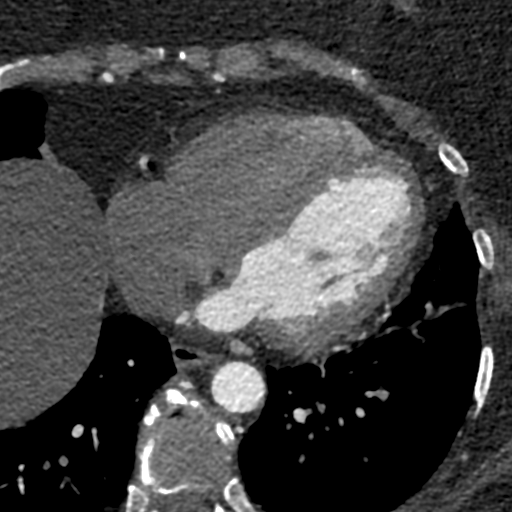
[im 224/336  vessel]
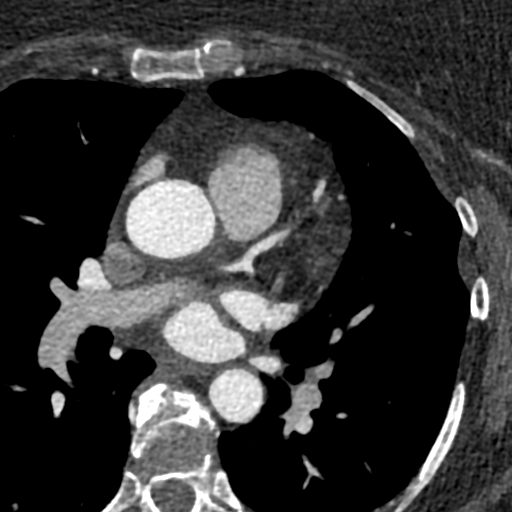

[Series 11: ts syst sharp · axial · 0.39mm/px · z∈[+1257,+1302]mm · 2 of 336 slices shown]
[im 112/336  lung]
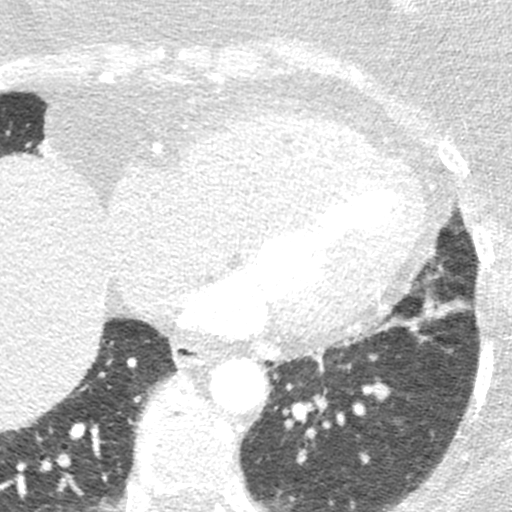
[im 224/336  lung]
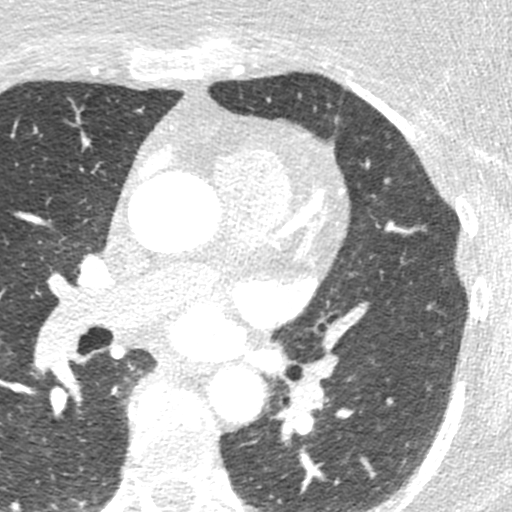

[8 of 20 positions shown; findings below may reference images not displayed]

FINDINGS: Vascular: Heart is normal size.  Aorta normal caliber.

Mediastinum/Nodes: No adenopathy

Lungs/Pleura: Linear scarring in the lung bases. No confluent
opacities or effusions.

Upper Abdomen: Imaging into the upper abdomen demonstrates no acute
findings.

Musculoskeletal: Chest wall soft tissues are unremarkable. No acute
bony abnormality.
IMPRESSION: No acute or significant extracardiac abnormality.
FINDINGS: A 130 kV prospective scan was triggered in the descending thoracic
aorta at 111 HU's. Axial non-contrast 3 mm slices were carried out
through the heart. The data set was analyzed on a dedicated work
station and scored using the Agatson method. Gantry rotation speed
was 250 msecs and collimation was .6 mm. No beta blockade and 0.8 mg
of sl NTG was given. The 3D data set was reconstructed in 5%
intervals of the 67-82 % of the R-R cycle. Diastolic phases were
analyzed on a dedicated work station using MPR, MIP and VRT modes.
The patient received 80 cc of contrast.

Aorta:  Normal size.  No calcifications.  No dissection.

Aortic Valve:  Trileaflet.  No calcifications.

Coronary Arteries:  Normal coronary origin.  Right dominance.

RCA is a large dominant artery that gives rise to PDA and PLA. There
is no plaque.

Left main is a large artery that gives rise to LAD and LCX arteries.

LAD is a large vessel that has no plaque. This artery gives rise to
small D1, small D2 and moderate size D3.

LCX is a non-dominant artery that gives rise to one large OM1
branch. There is no plaque.

Other findings:

Normal pulmonary vein drainage into the left atrium.

Normal left atrial appendage without a thrombus.

Normal size of the pulmonary artery.
IMPRESSION: 1. Coronary calcium score of 0. This was 0 percentile for age and
sex matched control.

2. Normal coronary origin with right dominance.

3. Grossly no evidence of CAD but please note that study was
suboptimal in quality to completely r/o disease in small arteries.

*** End of Addendum ***
EXAM:
OVER-READ INTERPRETATION  CT CHEST

The following report is an over-read performed by radiologist Dr.
Tashew Garrykishiyev [REDACTED] on 12/17/2021. This over-read
does not include interpretation of cardiac or coronary anatomy or
pathology. The coronary CTA interpretation by the cardiologist is
attached.
FINDINGS: Vascular: Heart is normal size.  Aorta normal caliber.

Mediastinum/Nodes: No adenopathy

Lungs/Pleura: Linear scarring in the lung bases. No confluent
opacities or effusions.

Upper Abdomen: Imaging into the upper abdomen demonstrates no acute
findings.

Musculoskeletal: Chest wall soft tissues are unremarkable. No acute
bony abnormality.
IMPRESSION: No acute or significant extracardiac abnormality.

## 2023-09-14 ENCOUNTER — Other Ambulatory Visit: Payer: Self-pay | Admitting: Family Medicine

## 2023-09-14 DIAGNOSIS — F331 Major depressive disorder, recurrent, moderate: Secondary | ICD-10-CM

## 2023-09-18 ENCOUNTER — Other Ambulatory Visit: Payer: Self-pay

## 2023-09-18 ENCOUNTER — Telehealth: Payer: Self-pay

## 2023-09-18 ENCOUNTER — Encounter: Payer: Self-pay | Admitting: Physician Assistant

## 2023-09-18 ENCOUNTER — Ambulatory Visit (INDEPENDENT_AMBULATORY_CARE_PROVIDER_SITE_OTHER): Payer: Managed Care, Other (non HMO) | Admitting: Physician Assistant

## 2023-09-18 DIAGNOSIS — M1712 Unilateral primary osteoarthritis, left knee: Secondary | ICD-10-CM

## 2023-09-18 MED ORDER — METHYLPREDNISOLONE ACETATE 40 MG/ML IJ SUSP
40.0000 mg | INTRAMUSCULAR | Status: AC | PRN
  Administered 2023-09-18: 40 mg via INTRA_ARTICULAR
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
## 2023-09-18 NOTE — Telephone Encounter (Signed)
Please get approval for L knee [place]

## 2023-10-01 NOTE — Telephone Encounter (Signed)
VOB submitted for Durolane, left knee.

## 2023-10-02 ENCOUNTER — Telehealth: Payer: Self-pay

## 2023-10-02 NOTE — Telephone Encounter (Signed)
Faxed completed PA form to [employer] at [phone] for Durolane, left knee. PA pending

## 2023-10-08 ENCOUNTER — Ambulatory Visit (INDEPENDENT_AMBULATORY_CARE_PROVIDER_SITE_OTHER): Payer: Managed Care, Other (non HMO)

## 2023-10-08 VITALS — BP 130/82 | HR 89 | Temp 97.2°F | Ht 63.0 in | Wt 266.5 lb

## 2023-10-08 DIAGNOSIS — E66813 Obesity, class 3: Secondary | ICD-10-CM

## 2023-10-08 DIAGNOSIS — R7303 Prediabetes: Secondary | ICD-10-CM

## 2023-10-08 DIAGNOSIS — F331 Major depressive disorder, recurrent, moderate: Secondary | ICD-10-CM | POA: Diagnosis not present

## 2023-10-08 DIAGNOSIS — Z6841 Body Mass Index (BMI) 40.0 and over, adult: Secondary | ICD-10-CM

## 2023-10-08 DIAGNOSIS — F50811 Binge eating disorder, moderate: Secondary | ICD-10-CM

## 2023-10-08 DIAGNOSIS — E039 Hypothyroidism, unspecified: Secondary | ICD-10-CM

## 2023-10-08 MED ORDER — SEMAGLUTIDE-WEIGHT MANAGEMENT 1.7 MG/0.75ML ~~LOC~~ SOAJ: 1.7000 mg | SUBCUTANEOUS | 0 refills | Status: DC

## 2023-10-08 MED ORDER — SEMAGLUTIDE-WEIGHT MANAGEMENT 2.4 MG/0.75ML ~~LOC~~ SOAJ: 2.4000 mg | SUBCUTANEOUS | 0 refills | Status: DC

## 2023-10-08 MED ORDER — SEMAGLUTIDE-WEIGHT MANAGEMENT 0.25 MG/0.5ML ~~LOC~~ SOAJ: 0.2500 mg | SUBCUTANEOUS | 0 refills | Status: AC

## 2023-10-08 MED ORDER — BUPROPION HCL ER (XL) 300 MG PO TB24: 300.0000 mg | ORAL_TABLET | Freq: Every day | ORAL | 2 refills | Status: DC

## 2023-10-08 MED ORDER — SEMAGLUTIDE-WEIGHT MANAGEMENT 1 MG/0.5ML ~~LOC~~ SOAJ: 1.0000 mg | SUBCUTANEOUS | 0 refills | Status: DC

## 2023-10-08 MED ORDER — LISDEXAMFETAMINE DIMESYLATE 30 MG PO CAPS: 30.0000 mg | ORAL_CAPSULE | Freq: Every day | ORAL | 0 refills | Status: DC

## 2023-10-08 MED ORDER — SEMAGLUTIDE-WEIGHT MANAGEMENT 0.5 MG/0.5ML ~~LOC~~ SOAJ: 0.5000 mg | SUBCUTANEOUS | 0 refills | Status: AC

## 2023-10-08 NOTE — Assessment & Plan Note (Signed)
 Hypothyroidism with last thyroid function test in March last year. Discussed potential impact on weight management. Plan to adjust medication based on test results. - Order thyroid function tests - Adjust thyroid medication if necessary

## 2023-10-08 NOTE — Assessment & Plan Note (Addendum)
 Vyvanse  previously effective for binge eating. Interested in restarting for management. Discussed potential benefits and side effects. - Refill Vyvanse  - Monitor for effectiveness and side effects   Discussed importance of regular exercise, healthy eating, and maintaining a balanced lifestyle. Encouraged starting with small, manageable exercise routines and gradually increasing activity levels. Discussed potential referral to a dietician if needed. - Encourage regular exercise - Encourage healthy eating - Consider referral to a dietician if needed  Follow-up - Schedule follow-up in 2 months - Order blood work including hemoglobin A1c, thyroid function tests, and cholesterol.

## 2023-10-08 NOTE — Assessment & Plan Note (Signed)
 Obesity with previous use of Vyvanse  and Ozempic  for weight management. Weight gain after discontinuation due to insurance issues. Interested in restarting Vyvanse  and trying Wegovy . Discussed comprehensive approach including diet, exercise, and medication. Explained risks of Vyvanse  (increased heart rate and blood pressure) and potential side effects of Wegovy  (nausea, stomach upset). Emphasized lifestyle changes and gradual dose increase for Wegovy . - Refill Vyvanse  - Prescribe Wegovy  - Encourage healthy eating and exercise - Monitor heart rate and blood pressure - Schedule follow-up in 2 months
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
## 2023-10-08 NOTE — Patient Instructions (Signed)
 VISIT SUMMARY:  Today, we discussed your weight management, binge eating disorder, prediabetes, and hypothyroidism. We reviewed your history with Vyvanse  and Ozempic , and you expressed interest in restarting Vyvanse  and trying Wegovy . We also talked about the importance of lifestyle changes, including diet and exercise, to help manage your conditions.  YOUR PLAN:  -OBESITY: Obesity is a condition characterized by excessive body fat. We will restart Vyvanse  and prescribe Wegovy  to help manage your weight. It's important to follow a healthy diet and exercise regularly. We will monitor your heart rate and blood pressure, and you should schedule a follow-up in 2 months.  -BINGE EATING DISORDER: Binge eating disorder involves regularly eating large amounts of food in a short period. Vyvanse  has been effective for you in the past, so we will restart it and monitor its effectiveness and any side effects.  -PREDIABETES: Prediabetes is a condition where blood sugar levels are higher than normal but not high enough to be classified as diabetes. We will order blood work, including hemoglobin A1c, and encourage you to maintain a healthy diet and exercise regularly to prevent progression to type 2 diabetes.  -HYPOTHYROIDISM: Hypothyroidism is a condition where the thyroid gland doesn't produce enough thyroid hormones. We will order thyroid function tests and adjust your medication if necessary to help with weight management.  -GENERAL HEALTH MAINTENANCE: Maintaining general health involves regular exercise, healthy eating, and a balanced lifestyle. Start with small, manageable exercise routines and gradually increase your activity levels. We may refer you to a dietician if needed.  INSTRUCTIONS:  Please schedule a follow-up appointment in 2 months. We will also need to do blood work, including hemoglobin A1c, thyroid function tests, and cholesterol.

## 2023-10-08 NOTE — Assessment & Plan Note (Signed)
 Prediabetes with hemoglobin A1c of 6.1 in November. Discussed importance of weight management and lifestyle changes to prevent progression to type 2 diabetes. Emphasized regular monitoring and healthy lifestyle. - Order blood work including hemoglobin A1c - Encourage healthy eating and exercise

## 2023-10-09 LAB — CBC WITH DIFFERENTIAL/PLATELET
Basophils Absolute: 0.1 10*3/uL (ref 0.0–0.2)
Basos: 1 %
EOS (ABSOLUTE): 0.3 10*3/uL (ref 0.0–0.4)
Eos: 4 %
Hematocrit: 40.9 % (ref 34.0–46.6)
Hemoglobin: 13.4 g/dL (ref 11.1–15.9)
Immature Grans (Abs): 0 10*3/uL (ref 0.0–0.1)
Immature Granulocytes: 0 %
Lymphocytes Absolute: 2.5 10*3/uL (ref 0.7–3.1)
Lymphs: 29 %
MCH: 28.5 pg (ref 26.6–33.0)
MCHC: 32.8 g/dL (ref 31.5–35.7)
MCV: 87 fL (ref 79–97)
Monocytes Absolute: 0.6 10*3/uL (ref 0.1–0.9)
Monocytes: 8 %
Neutrophils Absolute: 5 10*3/uL (ref 1.4–7.0)
Neutrophils: 58 %
Platelets: 347 10*3/uL (ref 150–450)
RBC: 4.71 x10E6/uL (ref 3.77–5.28)
RDW: 13.2 % (ref 11.7–15.4)
WBC: 8.4 10*3/uL (ref 3.4–10.8)

## 2023-10-09 LAB — COMPREHENSIVE METABOLIC PANEL
ALT: 23 [IU]/L (ref 0–32)
AST: 18 [IU]/L (ref 0–40)
Albumin: 4.1 g/dL (ref 3.8–4.9)
Alkaline Phosphatase: 109 [IU]/L (ref 44–121)
BUN/Creatinine Ratio: 22 (ref 12–28)
BUN: 20 mg/dL (ref 8–27)
Bilirubin Total: 0.2 mg/dL (ref 0.0–1.2)
CO2: 25 mmol/L (ref 20–29)
Calcium: 9.2 mg/dL (ref 8.7–10.3)
Chloride: 104 mmol/L (ref 96–106)
Creatinine, Ser: 0.89 mg/dL (ref 0.57–1.00)
Globulin, Total: 2.7 g/dL (ref 1.5–4.5)
Glucose: 100 mg/dL — ABNORMAL HIGH (ref 70–99)
Potassium: 5 mmol/L (ref 3.5–5.2)
Sodium: 143 mmol/L (ref 134–144)
Total Protein: 6.8 g/dL (ref 6.0–8.5)
eGFR: 74 mL/min/{1.73_m2} (ref >59)

## 2023-10-09 LAB — LIPID PANEL
Chol/HDL Ratio: 3.2 {ratio} (ref 0.0–4.4)
Cholesterol, Total: 165 mg/dL (ref 100–199)
HDL: 51 mg/dL (ref >39)
LDL Chol Calc (NIH): 93 mg/dL (ref 0–99)
Triglycerides: 116 mg/dL (ref 0–149)
VLDL Cholesterol Cal: 21 mg/dL (ref 5–40)

## 2023-10-09 LAB — HEMOGLOBIN A1C
Est. average glucose Bld gHb Est-mCnc: 123 mg/dL
Hgb A1c MFr Bld: 5.9 % — ABNORMAL HIGH (ref 4.8–5.6)

## 2023-10-09 LAB — TSH: TSH: 4.16 u[IU]/mL (ref 0.450–4.500)

## 2023-11-11 ENCOUNTER — Other Ambulatory Visit: Payer: Self-pay | Admitting: Family Medicine

## 2023-11-11 DIAGNOSIS — R7989 Other specified abnormal findings of blood chemistry: Secondary | ICD-10-CM

## 2023-11-12 ENCOUNTER — Other Ambulatory Visit: Payer: Self-pay

## 2023-11-12 DIAGNOSIS — M1712 Unilateral primary osteoarthritis, left knee: Secondary | ICD-10-CM

## 2023-11-17 ENCOUNTER — Ambulatory Visit (INDEPENDENT_AMBULATORY_CARE_PROVIDER_SITE_OTHER): Admitting: Physician Assistant

## 2023-11-17 ENCOUNTER — Encounter: Payer: Self-pay | Admitting: Physician Assistant

## 2023-11-17 DIAGNOSIS — M1712 Unilateral primary osteoarthritis, left knee: Secondary | ICD-10-CM | POA: Diagnosis not present

## 2023-11-17 MED ORDER — SODIUM HYALURONATE 60 MG/3ML IX PRSY
60.0000 mg | PREFILLED_SYRINGE | INTRA_ARTICULAR | Status: AC | PRN
  Administered 2023-11-17: 60 mg via INTRA_ARTICULAR

## 2023-11-17 MED ORDER — METHYLPREDNISOLONE ACETATE 40 MG/ML IJ SUSP
40.0000 mg | INTRAMUSCULAR | Status: AC | PRN
  Administered 2023-11-17: 40 mg via INTRA_ARTICULAR
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
## 2023-12-24 ENCOUNTER — Encounter: Payer: Self-pay | Admitting: Family Medicine

## 2023-12-24 ENCOUNTER — Other Ambulatory Visit: Payer: Self-pay | Admitting: Family Medicine

## 2023-12-24 ENCOUNTER — Ambulatory Visit (INDEPENDENT_AMBULATORY_CARE_PROVIDER_SITE_OTHER): Payer: Managed Care, Other (non HMO) | Admitting: Family Medicine

## 2023-12-24 VITALS — BP 136/82 | Temp 97.8°F | Ht 63.0 in | Wt 266.0 lb

## 2023-12-24 DIAGNOSIS — F331 Major depressive disorder, recurrent, moderate: Secondary | ICD-10-CM

## 2023-12-24 DIAGNOSIS — R7303 Prediabetes: Secondary | ICD-10-CM

## 2023-12-24 DIAGNOSIS — F988 Other specified behavioral and emotional disorders with onset usually occurring in childhood and adolescence: Secondary | ICD-10-CM | POA: Diagnosis not present

## 2023-12-24 DIAGNOSIS — E039 Hypothyroidism, unspecified: Secondary | ICD-10-CM | POA: Diagnosis not present

## 2023-12-24 DIAGNOSIS — Z6841 Body Mass Index (BMI) 40.0 and over, adult: Secondary | ICD-10-CM

## 2023-12-24 DIAGNOSIS — N3010 Interstitial cystitis (chronic) without hematuria: Secondary | ICD-10-CM

## 2023-12-24 DIAGNOSIS — F50811 Binge eating disorder, moderate: Secondary | ICD-10-CM

## 2023-12-24 DIAGNOSIS — K219 Gastro-esophageal reflux disease without esophagitis: Secondary | ICD-10-CM

## 2023-12-24 DIAGNOSIS — E66813 Obesity, class 3: Secondary | ICD-10-CM

## 2023-12-24 MED ORDER — ESCITALOPRAM OXALATE 10 MG PO TABS: 10.0000 mg | ORAL_TABLET | Freq: Every day | ORAL | 2 refills | Status: DC

## 2023-12-24 MED ORDER — LISDEXAMFETAMINE DIMESYLATE 40 MG PO CAPS: 40.0000 mg | ORAL_CAPSULE | ORAL | 0 refills | Status: DC

## 2023-12-24 NOTE — Assessment & Plan Note (Signed)
The current medical regimen is effective;  continue present plan and medications.  Omeprazole 40 mg daily 

## 2023-12-24 NOTE — Assessment & Plan Note (Signed)
 The current medical regimen is effective;  continue present plan and medications. Continue levothyroxine  25 mcg once daily in am.

## 2023-12-24 NOTE — Patient Instructions (Addendum)
 Recommend [facility] and [facility] counseling. Also hospice has grief counseling.  Start on lexaprol 10 mg daily.  Recommend restart D Mannose.

## 2023-12-24 NOTE — Assessment & Plan Note (Signed)
 Recommend continue to work on eating healthy diet and exercise.

## 2023-12-24 NOTE — Assessment & Plan Note (Signed)
 Not at goal.  Continue wellbutrin  xl. Add lexapro  10 mg daily.  Counseling recommendations given.

## 2023-12-24 NOTE — Assessment & Plan Note (Signed)
Restart D Mannose

## 2023-12-24 NOTE — Assessment & Plan Note (Signed)
 Restart vyvanse  40 mg daily.

## 2023-12-24 NOTE — Assessment & Plan Note (Signed)
Restart vyvanse.

## 2023-12-28 DIAGNOSIS — M6289 Other specified disorders of muscle: Secondary | ICD-10-CM | POA: Insufficient documentation

## 2024-01-11 ENCOUNTER — Telehealth: Payer: Self-pay

## 2024-01-11 ENCOUNTER — Encounter: Payer: Self-pay | Admitting: Family Medicine

## 2024-01-11 ENCOUNTER — Ambulatory Visit (INDEPENDENT_AMBULATORY_CARE_PROVIDER_SITE_OTHER): Admitting: Family Medicine

## 2024-01-11 VITALS — BP 120/70 | HR 97 | Temp 97.6°F | Resp 16 | Ht 63.0 in | Wt 258.4 lb

## 2024-01-11 DIAGNOSIS — K5732 Diverticulitis of large intestine without perforation or abscess without bleeding: Secondary | ICD-10-CM

## 2024-01-11 MED ORDER — CIPROFLOXACIN HCL 500 MG PO TABS: 500.0000 mg | ORAL_TABLET | Freq: Two times a day (BID) | ORAL | 0 refills | Status: AC

## 2024-01-11 MED ORDER — DICYCLOMINE HCL 10 MG PO CAPS: 10.0000 mg | ORAL_CAPSULE | Freq: Three times a day (TID) | ORAL | 0 refills | Status: DC | PRN
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
## 2024-01-11 NOTE — Assessment & Plan Note (Addendum)
 Flare-up after three years, presenting with lower central abdominal pain, green stool, and bloating, suggesting inflammation and possible infection. - Prescribed ciprofloxacin  500 mg twice daily for 7 days. - Prescribed dicyclomine as needed for cramping and spasms. - Advised follow-up in one week if symptoms persist or message for further evaluation.

## 2024-01-11 NOTE — Telephone Encounter (Signed)
 Called patient she stated she is having diarrhea and abd pain, and think it could be  Diverticulitis wanted to see if provider could call her in something for it. Made her aware that provider would want her to come be seen due to her symptoms, made her an appointment today with Laura Waters  Copied from CRM [id number]. Topic: General - Other >> Jan 11, 2024  8:20 AM Laura Waters wrote: Reason for CRM: patient would like a call back from dr cox nurse

## 2024-01-28 ENCOUNTER — Other Ambulatory Visit: Payer: Self-pay

## 2024-01-28 ENCOUNTER — Encounter: Payer: Self-pay | Admitting: Family Medicine

## 2024-01-28 MED ORDER — LISDEXAMFETAMINE DIMESYLATE 40 MG PO CAPS: 40.0000 mg | ORAL_CAPSULE | ORAL | 0 refills | Status: DC

## 2024-02-08 ENCOUNTER — Ambulatory Visit (INDEPENDENT_AMBULATORY_CARE_PROVIDER_SITE_OTHER): Admitting: Family Medicine

## 2024-02-08 ENCOUNTER — Encounter: Payer: Self-pay | Admitting: Family Medicine

## 2024-02-08 VITALS — BP 128/82 | HR 88 | Temp 98.1°F | Ht 63.0 in | Wt 262.0 lb

## 2024-02-08 DIAGNOSIS — F331 Major depressive disorder, recurrent, moderate: Secondary | ICD-10-CM

## 2024-02-08 DIAGNOSIS — R7303 Prediabetes: Secondary | ICD-10-CM | POA: Diagnosis not present

## 2024-02-08 DIAGNOSIS — R400 Somnolence: Secondary | ICD-10-CM

## 2024-02-08 DIAGNOSIS — E039 Hypothyroidism, unspecified: Secondary | ICD-10-CM | POA: Diagnosis not present

## 2024-02-08 DIAGNOSIS — F988 Other specified behavioral and emotional disorders with onset usually occurring in childhood and adolescence: Secondary | ICD-10-CM | POA: Diagnosis not present

## 2024-02-08 DIAGNOSIS — R202 Paresthesia of skin: Secondary | ICD-10-CM

## 2024-02-08 DIAGNOSIS — F50811 Binge eating disorder, moderate: Secondary | ICD-10-CM
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
## 2024-02-08 NOTE — Patient Instructions (Signed)
 Recommend make small dietary changes.  Ordering a sleep study.  Recommend start lexapro  10 mg daily.  Recommend counseling ([facility] and [facility] are good.)

## 2024-02-10 ENCOUNTER — Ambulatory Visit: Payer: Self-pay | Admitting: Family Medicine

## 2024-02-10 LAB — COMPREHENSIVE METABOLIC PANEL WITH GFR
ALT: 20 IU/L (ref 0–32)
AST: 18 IU/L (ref 0–40)
Albumin: 4.3 g/dL (ref 3.9–4.9)
Alkaline Phosphatase: 114 IU/L (ref 44–121)
BUN/Creatinine Ratio: 17 (ref 12–28)
BUN: 14 mg/dL (ref 8–27)
Bilirubin Total: <0.2 mg/dL (ref 0.0–1.2)
CO2: 19 mmol/L — ABNORMAL LOW (ref 20–29)
Calcium: 8.9 mg/dL (ref 8.7–10.3)
Chloride: 104 mmol/L (ref 96–106)
Creatinine, Ser: 0.81 mg/dL (ref 0.57–1.00)
Globulin, Total: 2.8 g/dL (ref 1.5–4.5)
Glucose: 120 mg/dL — ABNORMAL HIGH (ref 70–99)
Potassium: 4.3 mmol/L (ref 3.5–5.2)
Sodium: 141 mmol/L (ref 134–144)
Total Protein: 7.1 g/dL (ref 6.0–8.5)
eGFR: 83 mL/min/{1.73_m2} (ref >59)

## 2024-02-10 LAB — CBC WITH DIFFERENTIAL/PLATELET
Basophils Absolute: 0.1 10*3/uL (ref 0.0–0.2)
Basos: 1 %
EOS (ABSOLUTE): 0.2 10*3/uL (ref 0.0–0.4)
Eos: 3 %
Hematocrit: 42.6 % (ref 34.0–46.6)
Hemoglobin: 13.7 g/dL (ref 11.1–15.9)
Immature Grans (Abs): 0 10*3/uL (ref 0.0–0.1)
Immature Granulocytes: 0 %
Lymphocytes Absolute: 2 10*3/uL (ref 0.7–3.1)
Lymphs: 24 %
MCH: 28.9 pg (ref 26.6–33.0)
MCHC: 32.2 g/dL (ref 31.5–35.7)
MCV: 90 fL (ref 79–97)
Monocytes Absolute: 0.6 10*3/uL (ref 0.1–0.9)
Monocytes: 7 %
Neutrophils Absolute: 5.4 10*3/uL (ref 1.4–7.0)
Neutrophils: 65 %
Platelets: 361 10*3/uL (ref 150–450)
RBC: 4.74 x10E6/uL (ref 3.77–5.28)
RDW: 14.1 % (ref 11.7–15.4)
WBC: 8.4 10*3/uL (ref 3.4–10.8)

## 2024-02-10 LAB — LIPID PANEL
Chol/HDL Ratio: 3.3 ratio (ref 0.0–4.4)
Cholesterol, Total: 166 mg/dL (ref 100–199)
HDL: 50 mg/dL (ref >39)
LDL Chol Calc (NIH): 92 mg/dL (ref 0–99)
Triglycerides: 138 mg/dL (ref 0–149)
VLDL Cholesterol Cal: 24 mg/dL (ref 5–40)

## 2024-02-10 LAB — METHYLMALONIC ACID, SERUM: Methylmalonic Acid: 149 nmol/L (ref 0–378)

## 2024-02-10 LAB — HEMOGLOBIN A1C
Est. average glucose Bld gHb Est-mCnc: 117 mg/dL
Hgb A1c MFr Bld: 5.7 % — ABNORMAL HIGH (ref 4.8–5.6)

## 2024-02-10 LAB — B12 AND FOLATE PANEL
Folate: 14.3 ng/mL (ref >3.0)
Vitamin B-12: 1165 pg/mL (ref 232–1245)

## 2024-02-13 DIAGNOSIS — R400 Somnolence: Secondary | ICD-10-CM | POA: Insufficient documentation

## 2024-02-13 DIAGNOSIS — R202 Paresthesia of skin: Secondary | ICD-10-CM | POA: Insufficient documentation

## 2024-02-13 NOTE — Assessment & Plan Note (Signed)
Order sleep study 

## 2024-02-13 NOTE — Assessment & Plan Note (Signed)
 Recommend continue to work on eating healthy diet and exercise.

## 2024-02-13 NOTE — Assessment & Plan Note (Addendum)
 Continue vyvanse  40 mg daily.  Recommend small dietary changes.

## 2024-02-13 NOTE — Assessment & Plan Note (Signed)
 The current medical regimen is effective;  continue present plan and medications. Continue levothyroxine  25 mcg once daily in am.

## 2024-02-13 NOTE — Assessment & Plan Note (Signed)
 Not at goal.  Continue wellbutrin  xl. Add lexapro  10 mg daily.  Counseling recommendations given.

## 2024-02-13 NOTE — Assessment & Plan Note (Signed)
 Recommend continue to work on eating healthy diet and exercise. Make small dietary changes.

## 2024-02-13 NOTE — Assessment & Plan Note (Signed)
 Check labs

## 2024-02-13 NOTE — Assessment & Plan Note (Signed)
Well controlled.  - Continue vyvanse 40 mg daily.

## 2024-02-15 ENCOUNTER — Other Ambulatory Visit: Payer: Self-pay | Admitting: Family Medicine

## 2024-02-15 DIAGNOSIS — R7989 Other specified abnormal findings of blood chemistry: Secondary | ICD-10-CM

## 2024-03-08 ENCOUNTER — Telehealth: Payer: Self-pay

## 2024-03-08 NOTE — Telephone Encounter (Signed)
 I left voicemail to let her know that we have the results of Sleep study.

## 2024-03-09 NOTE — Telephone Encounter (Signed)
 The patient called in returning Marla's phone call concerning her sleep study results. Please assist patient further

## 2024-03-14 ENCOUNTER — Ambulatory Visit (INDEPENDENT_AMBULATORY_CARE_PROVIDER_SITE_OTHER): Admitting: Family Medicine

## 2024-03-14 VITALS — BP 110/70 | HR 88 | Temp 97.6°F | Resp 16 | Ht 63.0 in | Wt 261.4 lb

## 2024-03-14 DIAGNOSIS — G4733 Obstructive sleep apnea (adult) (pediatric): Secondary | ICD-10-CM | POA: Diagnosis not present

## 2024-03-14 DIAGNOSIS — F331 Major depressive disorder, recurrent, moderate: Secondary | ICD-10-CM

## 2024-03-14 DIAGNOSIS — F988 Other specified behavioral and emotional disorders with onset usually occurring in childhood and adolescence: Secondary | ICD-10-CM | POA: Diagnosis not present

## 2024-03-14 DIAGNOSIS — Z23 Encounter for immunization: Secondary | ICD-10-CM | POA: Diagnosis not present

## 2024-03-14 MED ORDER — TIRZEPATIDE-WEIGHT MANAGEMENT 2.5 MG/0.5ML ~~LOC~~ SOLN: 2.5000 mg | SUBCUTANEOUS | 0 refills | Status: DC

## 2024-03-14 MED ORDER — LISDEXAMFETAMINE DIMESYLATE 40 MG PO CAPS: 40.0000 mg | ORAL_CAPSULE | ORAL | 0 refills | Status: DC
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
## 2024-03-14 NOTE — Assessment & Plan Note (Signed)
 Inquired about measles vaccine due to outbreak concerns.  - Patient does not recall having the measles vaccine as a child. - It is recommended that adults born in 1955 or after be vaccinated with at least one dose. - Administer today

## 2024-03-14 NOTE — Assessment & Plan Note (Signed)
 Well controlled.  Continue vyvanse  40 mg daily.  - Sent refill to pharmacy

## 2024-03-14 NOTE — Assessment & Plan Note (Signed)
 Managed with Lexapro  and Wellbutrin . Mood has improved with added Lexapro .  - Provide PHQ questionnaire for depression assessment.    03/14/2024    9:17 AM 12/24/2023    9:02 AM 10/08/2023    9:28 AM  PHQ9 SCORE ONLY  PHQ-9 Total Score 8 14 9     - Continue Lexapro  10 mg daily. - Continue Wellbutrin  300 mg daily.

## 2024-03-14 NOTE — Assessment & Plan Note (Signed)
 Sleep study results pending. Zepbound  planned for symptom management. Previous Ozempic  use caused constipation and nausea. Insurance approval expected. - Send Zepbound  prescription to [facility] pharmacy. - Start Zepbound  2.5 mg weekly injections. - Schedule follow-up in one month to assess medication tolerance and consider dose increase to 5 mg if tolerated.

## 2024-03-18 ENCOUNTER — Other Ambulatory Visit: Payer: Self-pay | Admitting: Family Medicine

## 2024-03-18 DIAGNOSIS — G4733 Obstructive sleep apnea (adult) (pediatric): Secondary | ICD-10-CM

## 2024-03-21 ENCOUNTER — Telehealth: Payer: Self-pay

## 2024-03-21 ENCOUNTER — Other Ambulatory Visit: Payer: Self-pay | Admitting: Family Medicine

## 2024-03-21 DIAGNOSIS — K219 Gastro-esophageal reflux disease without esophagitis: Secondary | ICD-10-CM

## 2024-03-21 NOTE — Telephone Encounter (Signed)
 Per Covermymeds zepbound  Drug is not covered by plan.

## 2024-03-25 ENCOUNTER — Encounter: Payer: Self-pay | Admitting: Family Medicine

## 2024-03-28 NOTE — Telephone Encounter (Signed)
 Cancelled. Dr. Sedalia Muta

## 2024-04-11 ENCOUNTER — Encounter: Payer: Self-pay | Admitting: Family Medicine

## 2024-04-11 ENCOUNTER — Ambulatory Visit (INDEPENDENT_AMBULATORY_CARE_PROVIDER_SITE_OTHER): Admitting: Family Medicine

## 2024-04-11 VITALS — BP 110/72 | HR 81 | Temp 98.7°F | Resp 16 | Ht 63.0 in | Wt 263.0 lb

## 2024-04-11 DIAGNOSIS — G4733 Obstructive sleep apnea (adult) (pediatric): Secondary | ICD-10-CM | POA: Diagnosis not present

## 2024-04-11 DIAGNOSIS — F988 Other specified behavioral and emotional disorders with onset usually occurring in childhood and adolescence: Secondary | ICD-10-CM | POA: Diagnosis not present

## 2024-04-11 DIAGNOSIS — L732 Hidradenitis suppurativa: Secondary | ICD-10-CM | POA: Insufficient documentation

## 2024-04-11 MED ORDER — DOXYCYCLINE HYCLATE 100 MG PO TABS: 100.0000 mg | ORAL_TABLET | Freq: Two times a day (BID) | ORAL | 0 refills | Status: AC

## 2024-04-11 MED ORDER — LISDEXAMFETAMINE DIMESYLATE 40 MG PO CAPS
40.0000 mg | ORAL_CAPSULE | ORAL | 0 refills | Status: DC
Start: 2024-04-11 — End: 2024-05-17

## 2024-04-11 NOTE — Assessment & Plan Note (Signed)
 Obstructive sleep apnea Sleep study completed. Insurance did not approve Zepbound  as a treatment option Reluctance to use CPAP. Exploring alternative options. - Try mouth tape as an alternative to CPAP.

## 2024-04-11 NOTE — Assessment & Plan Note (Signed)
 Recurrent infection treated with doxycycline . Symptoms improved, some discomfort and lumps remain. No fever or drainage. - Monitor for recurrence.  - doxycycline  100 mg TWICE A DAY for 5 days - FU in 5 days if symptoms persist or worsen.

## 2024-04-11 NOTE — Assessment & Plan Note (Signed)
 Well controlled.  Continue vyvanse  40 mg daily.  - Sent refill to pharmacy
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
## 2024-05-06 ENCOUNTER — Other Ambulatory Visit: Payer: Self-pay | Admitting: Family Medicine

## 2024-05-11 ENCOUNTER — Ambulatory Visit: Admitting: Family Medicine

## 2024-05-17 ENCOUNTER — Ambulatory Visit (INDEPENDENT_AMBULATORY_CARE_PROVIDER_SITE_OTHER): Admitting: Family Medicine

## 2024-05-17 ENCOUNTER — Encounter: Payer: Self-pay | Admitting: Family Medicine

## 2024-05-17 VITALS — BP 122/68 | HR 86 | Temp 98.1°F | Resp 18 | Ht 63.0 in | Wt 262.8 lb

## 2024-05-17 DIAGNOSIS — K219 Gastro-esophageal reflux disease without esophagitis: Secondary | ICD-10-CM

## 2024-05-17 DIAGNOSIS — R233 Spontaneous ecchymoses: Secondary | ICD-10-CM

## 2024-05-17 DIAGNOSIS — F331 Major depressive disorder, recurrent, moderate: Secondary | ICD-10-CM

## 2024-05-17 DIAGNOSIS — K5904 Chronic idiopathic constipation: Secondary | ICD-10-CM

## 2024-05-17 DIAGNOSIS — F50811 Binge eating disorder, moderate: Secondary | ICD-10-CM

## 2024-05-17 DIAGNOSIS — G4733 Obstructive sleep apnea (adult) (pediatric): Secondary | ICD-10-CM | POA: Diagnosis not present

## 2024-05-17 DIAGNOSIS — M25571 Pain in right ankle and joints of right foot: Secondary | ICD-10-CM | POA: Diagnosis not present

## 2024-05-17 DIAGNOSIS — K7581 Nonalcoholic steatohepatitis (NASH): Secondary | ICD-10-CM

## 2024-05-17 DIAGNOSIS — M25572 Pain in left ankle and joints of left foot: Secondary | ICD-10-CM

## 2024-05-17 DIAGNOSIS — R7303 Prediabetes: Secondary | ICD-10-CM | POA: Diagnosis not present

## 2024-05-17 DIAGNOSIS — M150 Primary generalized (osteo)arthritis: Secondary | ICD-10-CM

## 2024-05-17 DIAGNOSIS — E039 Hypothyroidism, unspecified: Secondary | ICD-10-CM

## 2024-05-17 LAB — POCT GLYCOSYLATED HEMOGLOBIN (HGB A1C): Hemoglobin A1C: 5.6 % (ref 4.0–5.6)

## 2024-05-17 MED ORDER — FLUTICASONE PROPIONATE 50 MCG/ACT NA SUSP: 2.0000 | Freq: Every day | NASAL | 3 refills | Status: AC

## 2024-05-17 MED ORDER — LISDEXAMFETAMINE DIMESYLATE 50 MG PO CAPS
50.0000 mg | ORAL_CAPSULE | Freq: Every day | ORAL | 0 refills | Status: DC
Start: 2024-05-17 — End: 2024-06-24
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
## 2024-05-17 NOTE — Patient Instructions (Addendum)
  VISIT SUMMARY: During your visit, we discussed your concerns about bruising, joint pain, sleep apnea, and other health issues. We reviewed your current medications and made some adjustments to better manage your symptoms.  YOUR PLAN: OBSTRUCTIVE SLEEP APNEA: You have mild obstructive sleep apnea and prefer alternative treatments over CPAP. -Continue mouth taping as tolerated. -Encourage side sleeping. -Prescribe Flonase  for nasal drainage and coughing.  EASY BRUISING: You have new bruises on your legs and arms without a clear cause. -Order blood tests to evaluate causes of easy bruising.  OSTEOARTHRITIS OF MULTIPLE JOINTS: You have chronic osteoarthritis with pain and swelling, especially in your left knee and Achilles tendon. -Continue using CBD for pain relief. -Continue physical therapy exercises for your neck and shoulder. -Consider a repeat gel injection in your left knee if previous relief diminishes.  BINGE EATING DISORDER: You are currently managing this with Vyvanse  40 mg, but a higher dose was previously more effective. -Increase Vyvanse  dosage to 50 mg daily. May call for increase in a month if wishes.   DEPRESSION: You are currently managing this with Wellbutrin  XL 300 mg and Lexapro  10 mg, and have reported improvement in mood. -Continue current medication regimen. -Monitor mood and adjust Lexapro  dosage if needed.  HYPOTHYROIDISM: You are managing this with levothyroxine  25 mcg daily. -Continue current levothyroxine  dosage.  CONSTIPATION: You have intermittent constipation managed with Colace. -Continue Colace as needed.  ALLERGIC RHINITIS: Nasal drainage is contributing to coughing at night. -Prescribe Flonase  nasal spray, two sprays in each nostril once daily. -Consider using Flonase  alone or in combination with Claritin or Zyrtec.  GASTROESOPHAGEAL REFLUX DISEASE: You are managing this with omeprazole  40 mg daily. -Continue omeprazole  40 mg daily.  VITAMIN B12  DEFICIENCY: You are currently taking 1000 mcg of B12 daily. -Continue current B12 supplementation.  MENOPAUSAL AND POSTMENOPAUSAL DISORDERS: You are using Premarin vaginal cream every other day for menopausal symptoms. -Continue Premarin vaginal cream every other day.  PREDIABETES: Your A1c has improved to 5.6 from 5.7, but you have increased soda consumption. -Encourage dietary modifications to reduce sugar intake, such as substituting with Coke Zero or unsweetened tea.                      Contains text generated by [employer].                                 Contains text generated by [employer].

## 2024-05-18 LAB — COMPREHENSIVE METABOLIC PANEL WITH GFR
ALT: 17 IU/L (ref 0–32)
AST: 15 IU/L (ref 0–40)
Albumin: 4 g/dL (ref 3.9–4.9)
Alkaline Phosphatase: 118 IU/L (ref 49–135)
BUN/Creatinine Ratio: 17 (ref 12–28)
BUN: 14 mg/dL (ref 8–27)
Bilirubin Total: 0.2 mg/dL (ref 0.0–1.2)
CO2: 24 mmol/L (ref 20–29)
Calcium: 9.2 mg/dL (ref 8.7–10.3)
Chloride: 103 mmol/L (ref 96–106)
Creatinine, Ser: 0.83 mg/dL (ref 0.57–1.00)
Globulin, Total: 2.7 g/dL (ref 1.5–4.5)
Glucose: 90 mg/dL (ref 70–99)
Potassium: 4.9 mmol/L (ref 3.5–5.2)
Sodium: 140 mmol/L (ref 134–144)
Total Protein: 6.7 g/dL (ref 6.0–8.5)
eGFR: 80 mL/min/1.73 (ref >59)

## 2024-05-18 LAB — CBC WITH DIFFERENTIAL/PLATELET
Basophils Absolute: 0.1 x10E3/uL (ref 0.0–0.2)
Basos: 1 %
EOS (ABSOLUTE): 0.3 x10E3/uL (ref 0.0–0.4)
Eos: 4 %
Hematocrit: 41.9 % (ref 34.0–46.6)
Hemoglobin: 13.2 g/dL (ref 11.1–15.9)
Immature Grans (Abs): 0 x10E3/uL (ref 0.0–0.1)
Immature Granulocytes: 0 %
Lymphocytes Absolute: 2.1 x10E3/uL (ref 0.7–3.1)
Lymphs: 25 %
MCH: 28 pg (ref 26.6–33.0)
MCHC: 31.5 g/dL (ref 31.5–35.7)
MCV: 89 fL (ref 79–97)
Monocytes Absolute: 0.6 x10E3/uL (ref 0.1–0.9)
Monocytes: 8 %
Neutrophils Absolute: 5.4 x10E3/uL (ref 1.4–7.0)
Neutrophils: 62 %
Platelets: 347 x10E3/uL (ref 150–450)
RBC: 4.71 x10E6/uL (ref 3.77–5.28)
RDW: 13.3 % (ref 11.7–15.4)
WBC: 8.5 x10E3/uL (ref 3.4–10.8)

## 2024-05-18 LAB — SEDIMENTATION RATE: Sed Rate: 49 mm/h — ABNORMAL HIGH (ref 0–40)

## 2024-05-18 LAB — PROTIME-INR
INR: 0.9 (ref 0.9–1.2)
Prothrombin Time: 10.1 s (ref 9.1–12.0)

## 2024-05-18 LAB — C-REACTIVE PROTEIN: CRP: 14 mg/L — ABNORMAL HIGH (ref 0–10)

## 2024-05-18 LAB — CYCLIC CITRUL PEPTIDE ANTIBODY, IGG/IGA: Cyclic Citrullin Peptide Ab: 6 U (ref 0–19)

## 2024-05-18 LAB — RHEUMATOID FACTOR: Rheumatoid fact SerPl-aCnc: <10 [IU]/mL (ref <14.0)

## 2024-05-19 ENCOUNTER — Ambulatory Visit: Payer: Self-pay | Admitting: Family Medicine

## 2024-05-22 ENCOUNTER — Encounter: Payer: Self-pay | Admitting: Family Medicine

## 2024-05-22 DIAGNOSIS — L219 Seborrheic dermatitis, unspecified: Secondary | ICD-10-CM | POA: Insufficient documentation

## 2024-05-22 DIAGNOSIS — M150 Primary generalized (osteo)arthritis: Secondary | ICD-10-CM | POA: Insufficient documentation

## 2024-05-22 DIAGNOSIS — E538 Deficiency of other specified B group vitamins: Secondary | ICD-10-CM | POA: Insufficient documentation

## 2024-05-22 DIAGNOSIS — J301 Allergic rhinitis due to pollen: Secondary | ICD-10-CM | POA: Insufficient documentation

## 2024-05-22 DIAGNOSIS — K5904 Chronic idiopathic constipation: Secondary | ICD-10-CM | POA: Insufficient documentation

## 2024-05-22 DIAGNOSIS — R233 Spontaneous ecchymoses: Secondary | ICD-10-CM | POA: Insufficient documentation

## 2024-05-22 DIAGNOSIS — N959 Unspecified menopausal and perimenopausal disorder: Secondary | ICD-10-CM | POA: Insufficient documentation

## 2024-05-22 DIAGNOSIS — M25571 Pain in right ankle and joints of right foot: Secondary | ICD-10-CM | POA: Insufficient documentation

## 2024-05-22 NOTE — Assessment & Plan Note (Signed)
-   Order blood tests to evaluate causes of easy bruising.

## 2024-05-22 NOTE — Assessment & Plan Note (Addendum)
-   Increase Vyvanse  dosage to 50 mg daily.

## 2024-05-22 NOTE — Assessment & Plan Note (Signed)
 The current medical regimen is effective;  continue present plan and medications. Continue levothyroxine  25 mcg once daily in am.

## 2024-05-22 NOTE — Assessment & Plan Note (Signed)
-   Prescribe Flonase  nasal spray, two sprays in each nostril once daily. - Consider using Flonase  alone or in combination with Claritin or Zyrtec.

## 2024-05-22 NOTE — Assessment & Plan Note (Signed)
-   Continue CBD for pain relief. - Encourage continuation of physical therapy exercises for neck and shoulder. - Consider repeat gel injection in the left knee if previous relief diminishes.

## 2024-05-22 NOTE — Assessment & Plan Note (Addendum)
 Managed with Lexapro  and Wellbutrin . Mood has improved with added Lexapro .   - Provide PHQ questionnaire for depression assessment.    05/17/2024    2:33 PM 03/14/2024    9:17 AM 12/24/2023    9:02 AM  PHQ9 SCORE ONLY  PHQ-9 Total Score 8 8  14       Data saved with a previous flowsheet row definition    - Continue Lexapro  10 mg daily. - Continue Wellbutrin  300 mg daily.

## 2024-05-22 NOTE — Assessment & Plan Note (Addendum)
 Prefer cpap, but patient resistant to this.  - Encourage side sleeping. - Prescribe Flonase  for nasal drainage and coughing.

## 2024-05-22 NOTE — Assessment & Plan Note (Signed)
-   Encourage dietary modifications to reduce sugar intake, such as substituting with Coke Zero or unsweetened tea.

## 2024-05-22 NOTE — Assessment & Plan Note (Signed)
 Avoids excessive Tylenol use due to liver concerns.

## 2024-05-22 NOTE — Assessment & Plan Note (Addendum)
Check arthritis panel 

## 2024-05-22 NOTE — Assessment & Plan Note (Signed)
The current medical regimen is effective;  continue present plan and medications.  Omeprazole 40 mg daily 

## 2024-06-02 ENCOUNTER — Other Ambulatory Visit: Payer: Self-pay

## 2024-06-02 DIAGNOSIS — R899 Unspecified abnormal finding in specimens from other organs, systems and tissues: Secondary | ICD-10-CM

## 2024-06-22 ENCOUNTER — Encounter: Payer: Self-pay | Admitting: Family Medicine

## 2024-06-23 ENCOUNTER — Other Ambulatory Visit: Payer: Self-pay | Admitting: Family Medicine

## 2024-06-24 MED ORDER — LISDEXAMFETAMINE DIMESYLATE 50 MG PO CAPS: 50.0000 mg | ORAL_CAPSULE | Freq: Every day | ORAL | 0 refills | Status: DC

## 2024-07-04 ENCOUNTER — Encounter: Payer: Self-pay | Admitting: Radiology

## 2024-07-05 ENCOUNTER — Other Ambulatory Visit: Payer: Self-pay

## 2024-07-05 DIAGNOSIS — F331 Major depressive disorder, recurrent, moderate: Secondary | ICD-10-CM

## 2024-08-09 ENCOUNTER — Other Ambulatory Visit: Payer: Self-pay | Admitting: Family Medicine

## 2024-08-09 ENCOUNTER — Encounter: Payer: Self-pay | Admitting: Family Medicine

## 2024-08-09 MED ORDER — LISDEXAMFETAMINE DIMESYLATE 50 MG PO CAPS: 50.0000 mg | ORAL_CAPSULE | Freq: Every day | ORAL | 0 refills | Status: DC

## 2024-08-09 MED ORDER — LISDEXAMFETAMINE DIMESYLATE 50 MG PO CAPS: 50.0000 mg | ORAL_CAPSULE | Freq: Every day | ORAL | 0 refills | Status: AC

## 2024-08-19 ENCOUNTER — Encounter: Payer: Self-pay | Admitting: Internal Medicine

## 2024-08-19 ENCOUNTER — Other Ambulatory Visit: Payer: Self-pay | Admitting: Family Medicine

## 2024-08-19 ENCOUNTER — Ambulatory Visit

## 2024-08-19 ENCOUNTER — Ambulatory Visit: Admitting: Internal Medicine

## 2024-08-19 VITALS — BP 128/86 | HR 96 | Resp 18 | Ht 63.5 in | Wt 279.6 lb

## 2024-08-19 DIAGNOSIS — T50905A Adverse effect of unspecified drugs, medicaments and biological substances, initial encounter: Secondary | ICD-10-CM | POA: Diagnosis not present

## 2024-08-19 DIAGNOSIS — Z23 Encounter for immunization: Secondary | ICD-10-CM | POA: Diagnosis not present

## 2024-08-19 DIAGNOSIS — R519 Headache, unspecified: Secondary | ICD-10-CM

## 2024-08-19 MED ORDER — ACETAMINOPHEN-CODEINE 300-30 MG PO TABS: 1.0000 | ORAL_TABLET | ORAL | 0 refills | Status: AC | PRN

## 2024-08-19 NOTE — Patient Instructions (Addendum)
" ° °  Adverse reaction to local anesthetics Severe headache, nerve pain and sciatic pain post-lidocaine  suggest rebound pain, not true allergy.   In the past this has been well managed with very limited prescription pain medications. Ibuprofen and Tylenol have not helped.   Recommendations  - No indication for topical anesthetic testing  - Can Avoid lidocaine  and other amide local anesthetics for remote risk this is an allergy  - Can Use ester local anesthetics (list provided below)   - strong likelihood pain reaction will occur with ANY topical anesthetic however  - Develop pain management plan with preemptive pain medication.  -discuss timing of first dose to be scheduled prior to anesthetic wearing off  - Consider alternative anesthetics such as laughing gas   Follow up: as needed  "

## 2024-08-19 NOTE — Progress Notes (Signed)
 Patient reports significant pain issues following dental procedures, requesting tylenol with codeine. Patient is not a risk of narcotic abuse, per Dr. Sherre will provide rx.
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
## 2024-08-24 ENCOUNTER — Other Ambulatory Visit (HOSPITAL_BASED_OUTPATIENT_CLINIC_OR_DEPARTMENT_OTHER): Payer: Self-pay

## 2024-08-24 ENCOUNTER — Encounter (HOSPITAL_BASED_OUTPATIENT_CLINIC_OR_DEPARTMENT_OTHER): Payer: Self-pay

## 2024-08-24 ENCOUNTER — Ambulatory Visit (HOSPITAL_BASED_OUTPATIENT_CLINIC_OR_DEPARTMENT_OTHER)
Admission: EM | Admit: 2024-08-24 | Discharge: 2024-08-24 | Disposition: A | Discharge status: Home / Self Care | Attending: Family Medicine | Admitting: Family Medicine

## 2024-08-24 DIAGNOSIS — R1031 Right lower quadrant pain: Secondary | ICD-10-CM | POA: Diagnosis not present

## 2024-08-24 MED ORDER — AMOXICILLIN-POT CLAVULANATE 875-125 MG PO TABS
1.0000 | ORAL_TABLET | Freq: Two times a day (BID) | ORAL | 0 refills | Status: DC
  Filled 2024-08-24: qty 14, 7d supply, fill #0
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
## 2024-08-24 NOTE — ED Triage Notes (Signed)
 RLQ abd pain onset 3 days ago. Pain worse when sitting. Denies urinary symptoms. States on Monday, was lifting a case of water and pulled her back. Notes that this pain started shortly after that episode. Has had diverticulitis in past and that pain is not in this area. Denies diarrhea. Patient appears visibly uncomfortable. Patient does still have appendix.

## 2024-08-24 NOTE — Discharge Instructions (Addendum)
 I believe this is muscular. We will go ahead and treat for diverticulitis just to be safe. Take the antibiotics as prescribed.  If the pain worsens go to the ER. You can take Ibuprofen for pain as needed.

## 2024-08-26 ENCOUNTER — Encounter (HOSPITAL_BASED_OUTPATIENT_CLINIC_OR_DEPARTMENT_OTHER): Payer: Self-pay

## 2024-08-26 ENCOUNTER — Ambulatory Visit: Payer: Self-pay

## 2024-08-26 ENCOUNTER — Ambulatory Visit (HOSPITAL_BASED_OUTPATIENT_CLINIC_OR_DEPARTMENT_OTHER)
Admission: RE | Admit: 2024-08-26 | Discharge: 2024-08-26 | Disposition: A | Discharge status: Home / Self Care | Source: Ambulatory Visit

## 2024-08-26 VITALS — BP 161/99 | HR 78 | Temp 98.7°F | Resp 20

## 2024-08-26 DIAGNOSIS — M5441 Lumbago with sciatica, right side: Secondary | ICD-10-CM

## 2024-08-26 DIAGNOSIS — M62830 Muscle spasm of back: Secondary | ICD-10-CM | POA: Diagnosis not present

## 2024-08-26 MED ORDER — CYCLOBENZAPRINE HCL 5 MG PO TABS: 5.0000 mg | ORAL_TABLET | Freq: Every evening | ORAL | 0 refills | Status: DC | PRN

## 2024-08-26 NOTE — Discharge Instructions (Addendum)
 Lower back pain with right sciatica and muscle spasm: Continue heat, the TENS unit, diclofenac.  Add cyclobenzaprine , 5 mg, nightly for muscle spasms.  Do not use the cyclobenzaprine  and drive.  Encouraged light back exercises (see handout).  Follow-up with primary care if symptoms do not improve, worsen or new symptoms occur.  May need to see orthopedics for further workup of her back pain.
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
## 2024-08-26 NOTE — ED Triage Notes (Signed)
 Right side low back  pain onset Monday after lowering a case of water. Was seen on Christmas Eve for abd pain and patient thought the back pain was related to that pain. Patient states abd pain improved but back pain persists. Patient has been using diclofenac, CBD,hot water bottle, and a TENS unit.

## 2024-08-29 ENCOUNTER — Ambulatory Visit: Admitting: Family Medicine

## 2024-08-29 ENCOUNTER — Encounter: Payer: Self-pay | Admitting: Family Medicine

## 2024-08-29 VITALS — BP 136/78 | HR 90 | Temp 98.2°F | Ht 63.5 in | Wt 271.0 lb

## 2024-08-29 DIAGNOSIS — M545 Low back pain, unspecified: Secondary | ICD-10-CM | POA: Diagnosis not present

## 2024-08-29 DIAGNOSIS — T368X5A Adverse effect of other systemic antibiotics, initial encounter: Secondary | ICD-10-CM | POA: Diagnosis not present

## 2024-08-29 DIAGNOSIS — R1031 Right lower quadrant pain: Secondary | ICD-10-CM | POA: Insufficient documentation

## 2024-08-29 LAB — POCT URINALYSIS DIP (CLINITEK)
Bilirubin, UA: NEGATIVE
Blood, UA: NEGATIVE
Glucose, UA: NEGATIVE mg/dL
Ketones, POC UA: NEGATIVE mg/dL
Leukocytes, UA: NEGATIVE
Nitrite, UA: NEGATIVE
POC PROTEIN,UA: NEGATIVE
Spec Grav, UA: 1.015
Urobilinogen, UA: 0.2 U/dL
pH, UA: 6

## 2024-08-29 MED ORDER — DICLOFENAC POTASSIUM 50 MG PO TABS: 50.0000 mg | ORAL_TABLET | Freq: Three times a day (TID) | ORAL | 0 refills | Status: AC | PRN

## 2024-08-29 MED ORDER — CYCLOBENZAPRINE HCL 5 MG PO TABS: 5.0000 mg | ORAL_TABLET | Freq: Three times a day (TID) | ORAL | 0 refills | Status: AC | PRN

## 2024-08-29 NOTE — Patient Instructions (Addendum)
 INCREASE CYCLOBENZAPRINE  TO 5 MG THREE TIMES A DAY AS NEEDED  INCREASE DICLOFENAC  50 MG ONE THREE TIMES A DAY AS NEEDED.  EXERCISES GIVEN.  BACK XRAY ORDERED.    ABDOMINAL PAIN CONCERNING FOR DIVERTICULITIS. FINISH AUGMENTIN . AWAIT LABS.

## 2024-08-29 NOTE — Assessment & Plan Note (Addendum)
 Adverse effect of antibiotics Queasiness and mild diarrhea from Augmentin  for diverticulitis. Recommend she try to complete 2 days more of antibiotic.

## 2024-08-29 NOTE — Assessment & Plan Note (Addendum)
 Recent diverticulitis episode with improved abdominal pain on antibiotics, differential includes appendicitis but less likely.  - Ordered urinalysis to check for blood in urine. Negative. Not consistent with nephrolithiasis. - Ordered blood work to further evaluate condition. - If abdominal pain worsens, recommend go to ED for appendicitis work up.  - If wbc elevated, I will consider ct scan of abdomen.  Orders:   CBC with Differential/Platelet   Comprehensive metabolic panel with GFR
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
## 2024-08-29 NOTE — Assessment & Plan Note (Addendum)
 Acute right-sided low back pain post-twisting injury, exacerbated by sitting, relieved by lying on the side, no significant leg radiation. Increase diclofenac  to 50 mg three times a day as needed.  Increase flexeril  to 5 mg three times a day as needed.  Exercises given.  Ordered xray.  If not improving, let us  know and would recommend physical therapy.  Orders:   POCT URINALYSIS DIP (CLINITEK)   DG Lumbar Spine Complete; Future

## 2024-08-30 ENCOUNTER — Ambulatory Visit (HOSPITAL_BASED_OUTPATIENT_CLINIC_OR_DEPARTMENT_OTHER)
Admission: RE | Admit: 2024-08-30 | Discharge: 2024-08-30 | Disposition: A | Discharge status: Home / Self Care | Source: Ambulatory Visit | Attending: Family Medicine | Admitting: Family Medicine

## 2024-08-30 DIAGNOSIS — M545 Low back pain, unspecified: Secondary | ICD-10-CM | POA: Diagnosis not present

## 2024-08-30 LAB — COMPREHENSIVE METABOLIC PANEL WITH GFR
ALT: 30 IU/L (ref 0–32)
AST: 20 IU/L (ref 0–40)
Albumin: 4.3 g/dL (ref 3.9–4.9)
Alkaline Phosphatase: 106 IU/L (ref 49–135)
BUN/Creatinine Ratio: 24 (ref 12–28)
BUN: 23 mg/dL (ref 8–27)
Bilirubin Total: 0.3 mg/dL (ref 0.0–1.2)
CO2: 21 mmol/L (ref 20–29)
Calcium: 9.4 mg/dL (ref 8.7–10.3)
Chloride: 104 mmol/L (ref 96–106)
Creatinine, Ser: 0.96 mg/dL (ref 0.57–1.00)
Globulin, Total: 3.1 g/dL (ref 1.5–4.5)
Glucose: 93 mg/dL (ref 70–99)
Potassium: 4.9 mmol/L (ref 3.5–5.2)
Sodium: 139 mmol/L (ref 134–144)
Total Protein: 7.4 g/dL (ref 6.0–8.5)
eGFR: 67 mL/min/1.73

## 2024-08-30 LAB — CBC WITH DIFFERENTIAL/PLATELET
Basophils Absolute: 0.1 x10E3/uL (ref 0.0–0.2)
Basos: 1 %
EOS (ABSOLUTE): 0.3 x10E3/uL (ref 0.0–0.4)
Eos: 2 %
Hematocrit: 42.1 % (ref 34.0–46.6)
Hemoglobin: 13.9 g/dL (ref 11.1–15.9)
Immature Grans (Abs): 0 x10E3/uL (ref 0.0–0.1)
Immature Granulocytes: 0 %
Lymphocytes Absolute: 2.9 x10E3/uL (ref 0.7–3.1)
Lymphs: 27 %
MCH: 28.5 pg (ref 26.6–33.0)
MCHC: 33 g/dL (ref 31.5–35.7)
MCV: 86 fL (ref 79–97)
Monocytes Absolute: 0.9 x10E3/uL (ref 0.1–0.9)
Monocytes: 8 %
Neutrophils Absolute: 6.8 x10E3/uL (ref 1.4–7.0)
Neutrophils: 62 %
Platelets: 408 x10E3/uL (ref 150–450)
RBC: 4.87 x10E6/uL (ref 3.77–5.28)
RDW: 13.9 % (ref 11.7–15.4)
WBC: 10.9 x10E3/uL — ABNORMAL HIGH (ref 3.4–10.8)

## 2024-09-02 ENCOUNTER — Ambulatory Visit: Payer: Self-pay | Admitting: Family Medicine

## 2024-09-12 ENCOUNTER — Other Ambulatory Visit: Payer: Self-pay

## 2024-09-12 DIAGNOSIS — R9389 Abnormal findings on diagnostic imaging of other specified body structures: Secondary | ICD-10-CM

## 2024-09-16 ENCOUNTER — Ambulatory Visit: Admitting: Family Medicine

## 2024-09-16 VITALS — BP 130/82 | HR 58 | Temp 97.6°F | Ht 63.5 in | Wt 272.0 lb

## 2024-09-16 DIAGNOSIS — R7303 Prediabetes: Secondary | ICD-10-CM | POA: Diagnosis not present

## 2024-09-16 DIAGNOSIS — E039 Hypothyroidism, unspecified: Secondary | ICD-10-CM | POA: Diagnosis not present

## 2024-09-16 DIAGNOSIS — K219 Gastro-esophageal reflux disease without esophagitis: Secondary | ICD-10-CM

## 2024-09-16 DIAGNOSIS — G4733 Obstructive sleep apnea (adult) (pediatric): Secondary | ICD-10-CM

## 2024-09-16 DIAGNOSIS — F330 Major depressive disorder, recurrent, mild: Secondary | ICD-10-CM

## 2024-09-16 DIAGNOSIS — M545 Low back pain, unspecified: Secondary | ICD-10-CM

## 2024-09-16 DIAGNOSIS — F50811 Binge eating disorder, moderate: Secondary | ICD-10-CM

## 2024-09-16 DIAGNOSIS — K7581 Nonalcoholic steatohepatitis (NASH): Secondary | ICD-10-CM | POA: Diagnosis not present

## 2024-09-16 LAB — POCT LIPID PANEL
HDL: 49
LDL: 85
Non-HDL: 116
TC: 165
TRG: 157

## 2024-09-16 LAB — POCT GLYCOSYLATED HEMOGLOBIN (HGB A1C): HbA1c POC (<> result, manual entry): 5.7 %

## 2024-09-16 NOTE — Assessment & Plan Note (Addendum)
 Prefer cpap, but patient resistant to this.  - Encourage side sleeping.

## 2024-09-16 NOTE — Assessment & Plan Note (Addendum)
 Symptoms suggestive of hypothyroidism. Last thyroid test was almost a year ago. - Ordered thyroid function test. Orders:   T4, free   TSH
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
## 2024-09-16 NOTE — Assessment & Plan Note (Addendum)
 Chronic lumbar back pain with recent exacerbation. MRI pending for further evaluation. Managed with diclofenac  and zanaflex, providing some relief. - Continue diclofenac  twice daily. - Continue zanaflex once daily. - Await MRI results for further evaluation.

## 2024-09-16 NOTE — Assessment & Plan Note (Addendum)
 A1c is 5.7, indicating good control. Orders:   POCT glycosylated hemoglobin (Hb A1C)   POCT Lipid Panel

## 2024-09-16 NOTE — Assessment & Plan Note (Addendum)
 Anxiety well-controlled except during stressful situations like dental appointments. - Continue Wellbutrin  300 mg once daily. - Prescribed Xanax  for situational anxiety related to dental appointments.

## 2024-09-16 NOTE — Assessment & Plan Note (Addendum)
The current medical regimen is effective;  continue present plan and medications.  Omeprazole 40 mg daily 

## 2024-09-16 NOTE — Assessment & Plan Note (Addendum)
Recommend weight loss.  

## 2024-09-16 NOTE — Assessment & Plan Note (Addendum)
 Continue vyvanse  50 mg once daily

## 2024-09-17 ENCOUNTER — Ambulatory Visit: Payer: Self-pay | Admitting: Family Medicine

## 2024-09-17 LAB — TSH: TSH: 3.02 u[IU]/mL (ref 0.450–4.500)

## 2024-09-17 LAB — T4, FREE: Free T4: 1.25 ng/dL (ref 0.82–1.77)

## 2024-09-20 ENCOUNTER — Other Ambulatory Visit: Payer: Self-pay | Admitting: Family Medicine

## 2024-09-20 ENCOUNTER — Telehealth: Payer: Self-pay

## 2024-09-20 MED ORDER — ALPRAZOLAM 0.5 MG PO TABS: 0.5000 mg | ORAL_TABLET | Freq: Two times a day (BID) | ORAL | 0 refills | Status: AC | PRN

## 2024-09-20 NOTE — Telephone Encounter (Signed)
 Patient called and wanted to know if provider would call her in xanax  0.5 mg, She has a dentist appointment tomorrow, she discuss this with provider at her last appointment but pharmacy told her they have not got a rx for it. Please advise, Patient would like medication sent to [facility] on [address]

## 2024-09-25 ENCOUNTER — Encounter: Payer: Self-pay | Admitting: Family Medicine

## 2024-09-25 NOTE — Patient Instructions (Signed)
" °  VISIT SUMMARY: Today, we discussed your stress related to dental procedures, ongoing management of anxiety and depression, and several other health concerns including GERD, knee and back pain, and possible hypothyroidism.  YOUR PLAN: LUMBAR BACK PAIN: Chronic back pain with recent worsening. -Continue taking diclofenac  twice daily. -Continue taking zanaflex once daily. -Await MRI results for further evaluation.  HYPOTHYROIDISM: Symptoms suggestive of hypothyroidism. -Thyroid function test ordered.  GASTROESOPHAGEAL REFLUX DISEASE (GERD): Acid reflux well-controlled with medication. -Continue taking omeprazole  as prescribed.  OBSTRUCTIVE SLEEP APNEA: Sleep apnea previously managed with mouth tape, discontinued due to back pain.  MODERATE BINGE-EATING DISORDER: Occasional nighttime binging, managed with Vyvanse . -Continue taking Vyvanse  as prescribed.  MILD RECURRENT MAJOR DEPRESSIVE DISORDER: Anxiety well-controlled except during dental appointments. -Continue taking Wellbutrin  300 mg once daily. -Prescribed Xanax  for situational anxiety related to dental appointments.  PREDIABETES: A1c is 5.7, indicating good control. -Continue monitoring blood sugar levels.    Contains text generated by [employer].   "

## 2024-10-04 ENCOUNTER — Other Ambulatory Visit: Payer: Self-pay | Admitting: Family Medicine

## 2024-10-04 DIAGNOSIS — F331 Major depressive disorder, recurrent, moderate: Secondary | ICD-10-CM

## 2024-12-21 ENCOUNTER — Ambulatory Visit: Admitting: Family Medicine
